# Patient Record
Sex: Male | Born: 1970 | Race: Black or African American | Hispanic: No | Marital: Single | Smoking: Current every day smoker
Health system: Southern US, Community
[De-identification: ages and names within clinical notes are randomized; demographics above are authoritative.]

## PROBLEM LIST (undated history)

## (undated) DIAGNOSIS — R079 Chest pain, unspecified: Secondary | ICD-10-CM

## (undated) DIAGNOSIS — I10 Essential (primary) hypertension: Secondary | ICD-10-CM

## (undated) HISTORY — PX: APPENDECTOMY: SHX54

## (undated) HISTORY — DX: Chest pain, unspecified: R07.9

---

## 2006-07-04 ENCOUNTER — Emergency Department: Payer: Self-pay | Admitting: Internal Medicine

## 2006-08-02 ENCOUNTER — Emergency Department: Payer: Self-pay | Admitting: Emergency Medicine

## 2008-05-30 ENCOUNTER — Emergency Department: Payer: Self-pay | Admitting: Emergency Medicine

## 2008-06-01 ENCOUNTER — Emergency Department: Payer: Self-pay | Admitting: Emergency Medicine

## 2008-06-03 ENCOUNTER — Emergency Department: Payer: Self-pay | Admitting: Emergency Medicine

## 2008-08-12 ENCOUNTER — Emergency Department: Payer: Self-pay | Admitting: Emergency Medicine

## 2009-05-07 ENCOUNTER — Emergency Department (HOSPITAL_COMMUNITY): Admission: EM | Admit: 2009-05-07 | Discharge: 2009-05-07 | Payer: Self-pay | Admitting: Emergency Medicine

## 2009-06-19 ENCOUNTER — Emergency Department: Payer: Self-pay | Admitting: Emergency Medicine

## 2009-08-17 ENCOUNTER — Emergency Department: Payer: Self-pay | Admitting: Emergency Medicine

## 2012-10-30 ENCOUNTER — Encounter (HOSPITAL_COMMUNITY): Payer: Self-pay | Admitting: *Deleted

## 2012-10-30 ENCOUNTER — Emergency Department (HOSPITAL_COMMUNITY)
Admission: EM | Admit: 2012-10-30 | Discharge: 2012-10-30 | Disposition: A | Discharge status: Home / Self Care | Attending: Emergency Medicine | Admitting: Emergency Medicine

## 2012-10-30 DIAGNOSIS — Z79899 Other long term (current) drug therapy: Secondary | ICD-10-CM | POA: Insufficient documentation

## 2012-10-30 DIAGNOSIS — Z87891 Personal history of nicotine dependence: Secondary | ICD-10-CM | POA: Insufficient documentation

## 2012-10-30 DIAGNOSIS — R51 Headache: Secondary | ICD-10-CM | POA: Insufficient documentation

## 2012-10-30 MED ORDER — METOCLOPRAMIDE HCL 5 MG/ML IJ SOLN
10.0000 mg | Freq: Once | INTRAMUSCULAR | Status: AC
  Administered 2012-10-30: 10 mg via INTRAVENOUS
  Filled 2012-10-30: qty 2

## 2012-10-30 MED ORDER — SODIUM CHLORIDE 0.9 % IV BOLUS (SEPSIS)
1000.0000 mL | Freq: Once | INTRAVENOUS | Status: AC
  Administered 2012-10-30: 1000 mL via INTRAVENOUS

## 2012-10-30 MED ORDER — MAGNESIUM SULFATE 40 MG/ML IJ SOLN
2.0000 g | Freq: Once | INTRAMUSCULAR | Status: AC
  Administered 2012-10-30: 2 g via INTRAVENOUS
  Filled 2012-10-30: qty 50

## 2012-10-30 MED ORDER — DIPHENHYDRAMINE HCL 50 MG/ML IJ SOLN
25.0000 mg | Freq: Once | INTRAMUSCULAR | Status: AC
  Administered 2012-10-30: 25 mg via INTRAVENOUS
  Filled 2012-10-30: qty 1

## 2012-10-30 MED ORDER — DEXAMETHASONE SODIUM PHOSPHATE 4 MG/ML IJ SOLN
10.0000 mg | Freq: Once | INTRAMUSCULAR | Status: AC
  Administered 2012-10-30: 10 mg via INTRAVENOUS
  Filled 2012-10-30: qty 3

## 2012-10-30 MED ORDER — NAPROXEN 500 MG PO TABS: 500.0000 mg | ORAL_TABLET | Freq: Two times a day (BID) | ORAL | Status: DC

## 2012-10-30 NOTE — ED Notes (Signed)
Called report to Nurse Hyacinth Meeker at [facility] [place].

## 2012-10-30 NOTE — ED Notes (Signed)
Pt to department via EMS.  Reports headache x2 weeks with no relief from ibuprofen.

## 2012-10-30 NOTE — ED Provider Notes (Signed)
CSN: [id number]     Arrival date & time 10/30/12  1478 History   First MD Initiated Contact with Patient 10/30/12 [phone]     Chief Complaint  Patient presents with  . Headache   (Consider location/radiation/quality/duration/timing/severity/associated sxs/prior Treatment) HPI History provided by patient. Is currently incarcerated. He has had headache continuously for the last 2 weeks gradual onset and now severe despite taking ibuprofen. He has sensitivity to light and noise. He has had headaches like this in the past and is unable to recall the last time he had a headache this severe, but this is not the worse headache of his life. Headache is frontal and mostly left-sided and has moved posteriorly. No associated fevers or neck stiffness. No rash or extremity pain. No thunderclap headache.  no syncope. No known alleviating factors.   History reviewed. No pertinent past medical history. Past Surgical History  Procedure Laterality Date  . Appendectomy     History reviewed. No pertinent family history. History  Substance Use Topics  . Smoking status: Former Games developer  . Smokeless tobacco: Not on file  . Alcohol Use: No    Review of Systems  Constitutional: Negative for fever and chills.  HENT: Negative for neck pain and neck stiffness.   Eyes: Negative for pain.  Respiratory: Negative for shortness of breath.   Cardiovascular: Negative for chest pain.  Gastrointestinal: Negative for abdominal pain.  Genitourinary: Negative for dysuria.  Musculoskeletal: Negative for back pain.  Skin: Negative for rash.  Neurological: Positive for headaches. Negative for speech difficulty and numbness.  All other systems reviewed and are negative.    Allergies  Review of patient's allergies indicates no known allergies.  Home Medications   Current Outpatient Rx  Name  Route  Sig  Dispense  Refill  . famotidine (PEPCID) 20 MG tablet   Oral   Take 20 mg by mouth 2 (two) times daily.         Marland Kitchen  ibuprofen (ADVIL,MOTRIN) 200 MG tablet   Oral   Take 200 mg by mouth every 6 (six) hours as needed for pain.         . tamsulosin (FLOMAX) 0.4 MG CAPS capsule   Oral   Take 0.4 mg by mouth.         . naproxen (NAPROSYN) 500 MG tablet   Oral   Take 1 tablet (500 mg total) by mouth 2 (two) times daily.   30 tablet   0    BP 158/101  Pulse 68  Temp(Src) 98 F (36.7 C) (Oral)  Resp 18  Ht 5\' 9"  (1.753 m)  Wt 240 lb (108.863 kg)  BMI 35.43 kg/m2  SpO2 100% Physical Exam  Constitutional: He is oriented to person, place, and time. He appears well-developed and well-nourished.  HENT:  Head: Normocephalic and atraumatic.  Eyes: Conjunctivae and EOM are normal. Pupils are equal, round, and reactive to light.  Neck: Full passive range of motion without pain. Neck supple. No thyromegaly present.  No meningismus  Cardiovascular: Normal rate, regular rhythm, S1 normal, S2 normal and intact distal pulses.   Pulmonary/Chest: Effort normal and breath sounds normal.  Abdominal: Soft. Bowel sounds are normal. There is no tenderness. There is no CVA tenderness.  Musculoskeletal: Normal range of motion.  Neurological: He is alert and oriented to person, place, and time. He has normal strength and normal reflexes. No cranial nerve deficit or sensory deficit. He displays a negative Romberg sign. GCS eye subscore is 4. GCS verbal  subscore is 5. GCS motor subscore is 6.  Speech clear no pronator drift. No facial droop. No focal deficits  Skin: Skin is warm and dry. No rash noted. No cyanosis. Nails show no clubbing.  Psychiatric: He has a normal mood and affect. His speech is normal and behavior is normal.    ED Course  Procedures (including critical care time)  IV fluids and IV headache cocktail provided : Benadryl, Reglan, Decadron, magnesium  On recheck headache is significantly improved. Repeat normal neuro exam unchanged.  No fever or deficits or indication for emergent imaging at this  time. Plan discharge back to facility With recommendation for get 8 hours of continuous and uninterrupted sleep. Continue NSAIDs. Follow up with neurology for persistent symptoms. Return to emergency department for seizure, fever, altered mental status, difficulty with speech or gait, or any weakness or numbness  MDM   1. Headache    Improved with IV fluids and IV medications Serial evaluations, improved condition Vital signs and nurses notes reviewed and considered     Sunnie Nielsen, MD 10/30/12 [phone]

## 2018-05-02 ENCOUNTER — Other Ambulatory Visit: Payer: Self-pay

## 2018-05-02 ENCOUNTER — Emergency Department
Admission: EM | Admit: 2018-05-02 | Discharge: 2018-05-02 | Disposition: A | Discharge status: Home / Self Care | Attending: Emergency Medicine | Admitting: Emergency Medicine

## 2018-05-02 DIAGNOSIS — F17200 Nicotine dependence, unspecified, uncomplicated: Secondary | ICD-10-CM | POA: Insufficient documentation

## 2018-05-02 DIAGNOSIS — Z79899 Other long term (current) drug therapy: Secondary | ICD-10-CM | POA: Insufficient documentation

## 2018-05-02 DIAGNOSIS — G43009 Migraine without aura, not intractable, without status migrainosus: Secondary | ICD-10-CM

## 2018-05-02 DIAGNOSIS — G43909 Migraine, unspecified, not intractable, without status migrainosus: Secondary | ICD-10-CM | POA: Insufficient documentation

## 2018-05-02 MED ORDER — METOCLOPRAMIDE HCL 10 MG PO TABS: 10.0000 mg | ORAL_TABLET | Freq: Three times a day (TID) | ORAL | 0 refills | Status: DC | PRN

## 2018-05-02 MED ORDER — SODIUM CHLORIDE 0.9 % IV BOLUS
1000.0000 mL | Freq: Once | INTRAVENOUS | Status: AC
  Administered 2018-05-02: 1000 mL via INTRAVENOUS

## 2018-05-02 MED ORDER — METOCLOPRAMIDE HCL 5 MG/ML IJ SOLN
10.0000 mg | Freq: Once | INTRAMUSCULAR | Status: AC
  Administered 2018-05-02: 10 mg via INTRAVENOUS
  Filled 2018-05-02: qty 2

## 2018-05-02 MED ORDER — KETOROLAC TROMETHAMINE 30 MG/ML IJ SOLN
30.0000 mg | Freq: Once | INTRAMUSCULAR | Status: AC
  Administered 2018-05-02: 30 mg via INTRAVENOUS
  Filled 2018-05-02: qty 1

## 2018-05-02 MED ORDER — DIPHENHYDRAMINE HCL 50 MG/ML IJ SOLN
25.0000 mg | Freq: Once | INTRAMUSCULAR | Status: AC
  Administered 2018-05-02: 25 mg via INTRAVENOUS
  Filled 2018-05-02: qty 1

## 2018-05-02 MED ORDER — IBUPROFEN 600 MG PO TABS: 600.0000 mg | ORAL_TABLET | Freq: Four times a day (QID) | ORAL | 0 refills | Status: DC | PRN

## 2018-05-02 NOTE — ED Notes (Signed)
NAD noted at time of D/C. Pt denies questions or concerns. Pt ambulatory to the lobby at this time where he states his friend is waiting to give him a ride. Driving precautions reviewed with patient due to the sedative effects of the medications.

## 2018-05-02 NOTE — Discharge Instructions (Signed)
Return to the ER for new, worsening, persistent severe headache, vomiting, weakness, or any other new or worsening symptoms that concern you.

## 2018-05-02 NOTE — ED Triage Notes (Signed)
Pt presents via POV c/o headache x3 days. Not relieved with BC powders per report. + light/sound/touch sensitivity.

## 2018-05-02 NOTE — ED Triage Notes (Signed)
emesis

## 2018-05-02 NOTE — ED Provider Notes (Signed)
[facility] Emergency Department Provider Note ____________________________________________   First MD Initiated Contact with Patient 05/02/18 1401     (approximate)  I have reviewed the triage vital signs and the nursing notes.   HISTORY  Chief Complaint Headache    HPI Gabriel Flowers is a 48 y.o. male with PMH as noted below who presents with headache, gradual onset and persistent course over the last 3 days, mainly on the left side of his head and retro-orbital.  It is associated with photophobia, phonophobia, and with nausea and vomiting.  The patient reports that he has had similar headaches several times previously.  He does not have any medication at home for them.  He denies any fever or neck stiffness.  He has had no trauma.   History reviewed. No pertinent past medical history.  There are no active problems to display for this patient.   Past Surgical History:  Procedure Laterality Date  . APPENDECTOMY      Prior to Admission medications   Medication Sig Start Date End Date Taking? Authorizing Provider  famotidine (PEPCID) 20 MG tablet Take 20 mg by mouth 2 (two) times daily.    [provider]  ibuprofen (ADVIL,MOTRIN) 600 MG tablet Take 1 tablet (600 mg total) by mouth every 6 (six) hours as needed. 05/02/18   Dionne Bucy, MD  metoCLOPramide (REGLAN) 10 MG tablet Take 1 tablet (10 mg total) by mouth every 8 (eight) hours as needed for up to 5 days for nausea or vomiting (or headache). 05/02/18 05/07/18  Dionne Bucy, MD  naproxen (NAPROSYN) 500 MG tablet Take 1 tablet (500 mg total) by mouth 2 (two) times daily. 10/30/12   Sunnie Nielsen, MD  tamsulosin (FLOMAX) 0.4 MG CAPS capsule Take 0.4 mg by mouth.    [provider]    Allergies Patient has no known allergies.  History reviewed. No pertinent family history.  Social History Social History   Tobacco Use  . Smoking status: Current Every Day Smoker  .  Smokeless tobacco: Never Used  Substance Use Topics  . Alcohol use: No  . Drug use: No    Review of Systems  Constitutional: No fever. Eyes: No visual changes.  Positive for photophobia. ENT: No neck pain. Cardiovascular: Denies chest pain. Respiratory: Denies shortness of breath. Gastrointestinal: Positive for nausea and vomiting.  Genitourinary: Negative for flank pain.  Musculoskeletal: Negative for back pain. Skin: Negative for rash. Neurological: Positive for headache.   ____________________________________________   PHYSICAL EXAM:  VITAL SIGNS: ED Triage Vitals  Enc Vitals Group     BP 05/02/18 1226 (!) 134/107     Pulse Rate 05/02/18 1226 88     Resp 05/02/18 1226 16     Temp 05/02/18 1226 97.7 F (36.5 C)     Temp Source 05/02/18 1226 Oral     SpO2 05/02/18 1226 98 %     Weight 05/02/18 1227 240 lb (108.9 kg)     Height 05/02/18 1227 5\' 9"  (1.753 m)     Head Circumference --      Peak Flow --      Pain Score 05/02/18 1227 10     Pain Loc --      Pain Edu? --      Excl. in GC? --     Constitutional: Alert and oriented.  Uncomfortable appearing but in no acute distress. Eyes: Conjunctivae are normal.  EOMI.  PERRLA. Head: Atraumatic. Nose: No congestion/rhinnorhea. Mouth/Throat: Mucous membranes are  moist.   Neck: Normal range of motion.  Cardiovascular: Good peripheral circulation. Respiratory: Normal respiratory effort.   Gastrointestinal: No distention.  Musculoskeletal: Extremities warm and well perfused.  Neurologic:  Normal speech and language.  Motor intact in all extremities.  Normal coordination with no ataxia.  No facial droop. Skin:  Skin is warm and dry. No rash noted. Psychiatric: Mood and affect are normal. Speech and behavior are normal.  ____________________________________________   LABS (all labs ordered are listed, but only abnormal results are displayed)  Labs Reviewed - No data to  display ____________________________________________  EKG  ____________________________________________  RADIOLOGY    ____________________________________________   PROCEDURES  Procedure(s) performed: No  Procedures  Critical Care performed: No ____________________________________________   INITIAL IMPRESSION / ASSESSMENT AND PLAN / ED COURSE  Pertinent labs & imaging results that were available during my care of the patient were reviewed by me and considered in my medical decision making (see chart for details).  48 year old male with PMH as noted above presents with gradual onset headache over the last 3 days, associated with nausea and vomiting as well as with photophobia and phonophobia.  He reports a prior history of similar headaches.  I reviewed the past medical records in Epic.  The patient was seen in the ED here in 2014 with a similar headache which improved with Reglan and Benadryl.  The patient reports he had a similar episode in 2016.  Overall presentation is consistent with migraine type headache.  Given the history of similar headaches in the past and the normal neurologic exam, there is no evidence of meningitis or SAH, and no indication for imaging. We will give Reglan, Toradol, Benadryl, and reassess.  ----------------------------------------- 4:00 PM on 05/02/2018 -----------------------------------------  The patient is feeling much better after the medications.  He feels well to go home.  He is stable for discharge at this time.  Return precautions given, and he expressed understanding. ____________________________________________   FINAL CLINICAL IMPRESSION(S) / ED DIAGNOSES  Final diagnoses:  Migraine without aura and without status migrainosus, not intractable      NEW MEDICATIONS STARTED DURING THIS VISIT:  New Prescriptions   IBUPROFEN (ADVIL,MOTRIN) 600 MG TABLET    Take 1 tablet (600 mg total) by mouth every 6 (six) hours as needed.    METOCLOPRAMIDE (REGLAN) 10 MG TABLET    Take 1 tablet (10 mg total) by mouth every 8 (eight) hours as needed for up to 5 days for nausea or vomiting (or headache).     Note:  This document was prepared using Dragon voice recognition software and may include unintentional dictation errors.    Dionne Bucy, MD 05/02/18 1600

## 2018-07-24 ENCOUNTER — Ambulatory Visit
Admission: EM | Admit: 2018-07-24 | Discharge: 2018-07-24 | Disposition: A | Discharge status: Home / Self Care | Payer: Self-pay

## 2018-07-24 ENCOUNTER — Encounter: Payer: Self-pay | Admitting: Emergency Medicine

## 2018-07-24 ENCOUNTER — Other Ambulatory Visit: Payer: Self-pay

## 2018-07-24 DIAGNOSIS — L0501 Pilonidal cyst with abscess: Secondary | ICD-10-CM

## 2018-07-24 MED ORDER — SULFAMETHOXAZOLE-TRIMETHOPRIM 800-160 MG PO TABS: 1.0000 | ORAL_TABLET | Freq: Two times a day (BID) | ORAL | 0 refills | Status: AC

## 2018-07-24 MED ORDER — OXYCODONE-ACETAMINOPHEN 5-325 MG PO TABS: 1.0000 | ORAL_TABLET | Freq: Four times a day (QID) | ORAL | 0 refills | Status: DC | PRN

## 2018-07-24 NOTE — ED Triage Notes (Signed)
Patient c/o abscess on his buttock for the past 2 days.

## 2018-07-24 NOTE — Discharge Instructions (Addendum)
Take medication as prescribed. Rest. Drink plenty of fluids.  Keep clean.  Warm compresses and heating pad.  Need to follow-up with surgery as soon as possible.  See above to schedule appointment this coming week.  Follow up with your primary care physician this week as needed. Return to Urgent care for new or worsening concerns.

## 2018-07-24 NOTE — ED Provider Notes (Signed)
[facility] ____________________________________________  Time seen: Approximately 4:20 PM  I have reviewed the triage vital signs and the nursing notes.   HISTORY  Chief Complaint Abscess   HPI Gabriel Flowers is a 48 y.o. male presenting for evaluation of tenderness and skin changes to buttocks.  Patient reports he has had a history of this in the past.  Denies recent flareup.  Denies injury or insect bite.  States the area has flared up in the last 2 days.  States area is very tender.  States has noticed some drainage today.  Denies fevers.  Continues to urinate and move bowels normally.  Denies abdominal pain, chest pain, shortness of breath, fevers or other complaints.  Denies other aggravating leaving factors.   History reviewed. No pertinent past medical history.  There are no active problems to display for this patient.   Past Surgical History:  Procedure Laterality Date  . APPENDECTOMY       No current facility-administered medications for this encounter.   Current Outpatient Medications:  .  amLODipine (NORVASC) 10 MG tablet, Take by mouth., Disp: , Rfl:  .  famotidine (PEPCID) 20 MG tablet, Take 20 mg by mouth 2 (two) times daily., Disp: , Rfl:  .  fluticasone (FLONASE) 50 MCG/ACT nasal spray, 2 sprays by Each Nare route daily., Disp: , Rfl:  .  tamsulosin (FLOMAX) 0.4 MG CAPS capsule, Take 0.4 mg by mouth., Disp: , Rfl:  .  furosemide (LASIX) 40 MG tablet, Take by mouth., Disp: , Rfl:  .  ibuprofen (ADVIL,MOTRIN) 600 MG tablet, Take 1 tablet (600 mg total) by mouth every 6 (six) hours as needed., Disp: 30 tablet, Rfl: 0 .  metoCLOPramide (REGLAN) 10 MG tablet, Take 1 tablet (10 mg total) by mouth every 8 (eight) hours as needed for up to 5 days for nausea or vomiting (or headache)., Disp: 12 tablet, Rfl: 0 .  naproxen (NAPROSYN) 500 MG tablet, Take 1 tablet (500 mg total) by mouth 2 (two) times daily., Disp: 30 tablet, Rfl: 0 .  oxyCODONE-acetaminophen  (PERCOCET/ROXICET) 5-325 MG tablet, Take 1 tablet by mouth every 6 (six) hours as needed for severe pain. Do not drive while taking as can cause drowsiness., Disp: 8 tablet, Rfl: 0 .  sulfamethoxazole-trimethoprim (BACTRIM DS) 800-160 MG tablet, Take 1 tablet by mouth 2 (two) times daily for 10 days., Disp: 20 tablet, Rfl: 0  Allergies Patient has no known allergies.  Family History  Problem Relation Age of Onset  . Osteoarthritis Mother   . Osteoarthritis Father     Social History Social History   Tobacco Use  . Smoking status: Current Every Day Smoker    Types: Cigarettes  . Smokeless tobacco: Never Used  Substance Use Topics  . Alcohol use: No  . Drug use: No    Review of Systems Constitutional: No fever Cardiovascular: Denies chest pain. Respiratory: Denies shortness of breath. Gastrointestinal: No abdominal pain.  No nausea, no vomiting.  No diarrhea.  No constipation. Genitourinary: Negative for dysuria. Musculoskeletal: Negative for back pain. Skin:positive skin changes  ____________________________________________   PHYSICAL EXAM:  VITAL SIGNS: ED Triage Vitals  Enc Vitals Group     BP 07/24/18 1445 (!) 149/82     Pulse Rate 07/24/18 1445 88     Resp 07/24/18 1445 16     Temp 07/24/18 1445 98.2 F (36.8 C)     Temp Source 07/24/18 1445 Oral     SpO2 07/24/18 1445 100 %  Weight 07/24/18 1442 200 lb (90.7 kg)     Height 07/24/18 1442 5\' 9"  (1.753 m)     Head Circumference --      Peak Flow --      Pain Score 07/24/18 1442 10     Pain Loc --      Pain Edu? --      Excl. in GC? --     Constitutional: Alert and oriented. Well appearing and in no acute distress. ENT      Head: Normocephalic and atraumatic. Cardiovascular: Normal rate, regular rhythm. Grossly normal heart sounds.  Good peripheral circulation. Respiratory: Normal respiratory effort without tachypnea nor retractions. Breath sounds are clear and equal bilaterally. No wheezes, rales,  rhonchi. Musculoskeletal:  Steady gait.  Neurologic:  Normal speech and language. Speech is normal. No gait instability.  Skin:  Skin is warm, dry.  Except: Left-sided gluteal cleft area of approximately 2 x 3 cm firmness with mild erythema with mild active drainage, no fluctuance, moderately tender.  Left upper gluteal cleft area erythematous 1 x 1 cm induration, no fluctuance, no drainage. Psychiatric: Mood and affect are normal. Speech and behavior are normal. Patient exhibits appropriate insight and judgment   ___________________________________________   LABS (all labs ordered are listed, but only abnormal results are displayed)  Labs Reviewed - No data to display   PROCEDURES Procedures   INITIAL IMPRESSION / ASSESSMENT AND PLAN / ED COURSE  Pertinent labs & imaging results that were available during my care of the patient were reviewed by me and considered in my medical decision making (see chart for details).  Well-appearing patient.  No acute distress.  Patient with pilonidal abscess.  Larger actively draining.  Will start patient on oral Bactrim, PRN Percocet, recommend keeping clean and warm compresses.  Follow-up surgeon this week.  Information given.Discussed indication, risks and benefits of medications with patient.  Discussed follow up with Primary care physician this week. Discussed follow up and return parameters including no resolution or any worsening concerns. Patient verbalized understanding and agreed to plan.   [place] controlled substance database reviewed, no recent controlled substances documented.  ____________________________________________   FINAL CLINICAL IMPRESSION(S) / ED DIAGNOSES  Final diagnoses:  Pilonidal abscess     ED Discharge Orders         Ordered    sulfamethoxazole-trimethoprim (BACTRIM DS) 800-160 MG tablet  2 times daily     07/24/18 1623    oxyCODONE-acetaminophen (PERCOCET/ROXICET) 5-325 MG tablet  Every 6 hours PRN      07/24/18 1623           Note: This dictation was prepared with Dragon dictation along with smaller phrase technology. Any transcriptional errors that result from this process are unintentional.         Renford Dills, NP 07/24/18 1824

## 2018-08-23 ENCOUNTER — Observation Stay: Payer: Self-pay

## 2018-08-23 ENCOUNTER — Observation Stay
Admission: EM | Admit: 2018-08-23 | Discharge: 2018-08-24 | Disposition: A | Discharge status: Home / Self Care | Payer: Self-pay | Attending: Internal Medicine | Admitting: Internal Medicine

## 2018-08-23 ENCOUNTER — Encounter: Payer: Self-pay | Admitting: *Deleted

## 2018-08-23 ENCOUNTER — Other Ambulatory Visit: Payer: Self-pay

## 2018-08-23 ENCOUNTER — Emergency Department: Payer: Self-pay

## 2018-08-23 ENCOUNTER — Ambulatory Visit
Admission: EM | Admit: 2018-08-23 | Discharge: 2018-08-23 | Disposition: A | Discharge status: Home / Self Care | Payer: Self-pay

## 2018-08-23 DIAGNOSIS — H409 Unspecified glaucoma: Secondary | ICD-10-CM | POA: Diagnosis present

## 2018-08-23 DIAGNOSIS — Z1159 Encounter for screening for other viral diseases: Secondary | ICD-10-CM | POA: Insufficient documentation

## 2018-08-23 DIAGNOSIS — I10 Essential (primary) hypertension: Secondary | ICD-10-CM

## 2018-08-23 DIAGNOSIS — H5461 Unqualified visual loss, right eye, normal vision left eye: Secondary | ICD-10-CM

## 2018-08-23 DIAGNOSIS — H3411 Central retinal artery occlusion, right eye: Principal | ICD-10-CM | POA: Diagnosis present

## 2018-08-23 DIAGNOSIS — Z79899 Other long term (current) drug therapy: Secondary | ICD-10-CM | POA: Insufficient documentation

## 2018-08-23 DIAGNOSIS — F17210 Nicotine dependence, cigarettes, uncomplicated: Secondary | ICD-10-CM | POA: Insufficient documentation

## 2018-08-23 HISTORY — DX: Essential (primary) hypertension: I10

## 2018-08-23 HISTORY — DX: Central retinal artery occlusion, right eye: H34.11

## 2018-08-23 LAB — CBC
HCT: 40.6 % (ref 39.0–52.0)
Hemoglobin: 14.3 g/dL (ref 13.0–17.0)
MCH: 32.8 pg (ref 26.0–34.0)
MCHC: 35.2 g/dL (ref 30.0–36.0)
MCV: 93.1 fL (ref 80.0–100.0)
Platelets: 237 10*3/uL (ref 150–400)
RBC: 4.36 MIL/uL (ref 4.22–5.81)
RDW: 13.2 % (ref 11.5–15.5)
WBC: 7.1 10*3/uL (ref 4.0–10.5)
nRBC: 0 % (ref 0.0–0.2)

## 2018-08-23 LAB — BASIC METABOLIC PANEL
Anion gap: 9 (ref 5–15)
BUN: 15 mg/dL (ref 6–20)
CO2: 23 mmol/L (ref 22–32)
Calcium: 9.3 mg/dL (ref 8.9–10.3)
Chloride: 106 mmol/L (ref 98–111)
Creatinine, Ser: 1.03 mg/dL (ref 0.61–1.24)
GFR calc Af Amer: >60 mL/min (ref >60)
GFR calc non Af Amer: >60 mL/min (ref >60)
Glucose, Bld: 90 mg/dL (ref 70–99)
Potassium: 3.8 mmol/L (ref 3.5–5.1)
Sodium: 138 mmol/L (ref 135–145)

## 2018-08-23 LAB — TROPONIN I (HIGH SENSITIVITY): Troponin I (High Sensitivity): 2 ng/L (ref <18)

## 2018-08-23 MED ORDER — LORAZEPAM 2 MG/ML IJ SOLN
1.0000 mg | INTRAMUSCULAR | Status: AC
  Administered 2018-08-24: 2 mg via INTRAVENOUS
  Filled 2018-08-23: qty 1

## 2018-08-23 MED ORDER — ACETAMINOPHEN 650 MG RE SUPP: 650.0000 mg | Freq: Four times a day (QID) | RECTAL | Status: DC | PRN

## 2018-08-23 MED ORDER — ACETAMINOPHEN 325 MG PO TABS: 650.0000 mg | ORAL_TABLET | Freq: Four times a day (QID) | ORAL | Status: DC | PRN

## 2018-08-23 MED ORDER — SODIUM CHLORIDE 0.9% FLUSH: 3.0000 mL | Freq: Once | INTRAVENOUS | Status: DC

## 2018-08-23 MED ORDER — GADOBUTROL 1 MMOL/ML IV SOLN
10.0000 mL | Freq: Once | INTRAVENOUS | Status: AC | PRN
  Administered 2018-08-24: 15:00:00 10 mL via INTRAVENOUS

## 2018-08-23 MED ORDER — ASPIRIN 81 MG PO CHEW
324.0000 mg | CHEWABLE_TABLET | Freq: Once | ORAL | Status: AC
  Administered 2018-08-23: 324 mg via ORAL
  Filled 2018-08-23: qty 4

## 2018-08-23 MED ORDER — ONDANSETRON HCL 4 MG PO TABS: 4.0000 mg | ORAL_TABLET | Freq: Four times a day (QID) | ORAL | Status: DC | PRN

## 2018-08-23 MED ORDER — ENOXAPARIN SODIUM 40 MG/0.4ML ~~LOC~~ SOLN: 40.0000 mg | SUBCUTANEOUS | Status: DC

## 2018-08-23 MED ORDER — ONDANSETRON HCL 4 MG/2ML IJ SOLN: 4.0000 mg | Freq: Four times a day (QID) | INTRAMUSCULAR | Status: DC | PRN

## 2018-08-23 NOTE — ED Notes (Signed)
Report called to Cynthia, RN

## 2018-08-23 NOTE — ED Provider Notes (Signed)
[facility] ____________________________________________  Time seen: Approximately 1:41 PM  I have reviewed the triage vital signs and the nursing notes.   HISTORY  Chief Complaint Loss of Vision  HPI Gabriel Flowers is a 48 y.o. male presenting for evaluation of vision loss to right eye.  Patient reports this morning at 6:30 AM he pressed the garage door open button as the door opened and the light shining through he lost vision to his right eye.  States left eye vision is normal.  States right eye lateral vision is somewhat fuzzy, but reports the medial right eye vision is blackish to purple and only seen some outlines.  Denies pain to right eye.  Denies any trauma or foreign body to right eye.  States he had something similar happen approximately 2 months ago but only lasted for 1 hour and then fully resolved without any recurrence.  States that he knows he needs glasses, but denies any other eye issues.  Denies headache, paresthesias, weakness, unilateral weakness, dizziness, injury.  Denies recent cough, congestion, chest pain, shortness of breath or fevers.  Reports otherwise feels well.  Again denies any pain.  Denies aggravating or alleviating factors.    Past Medical History:  Diagnosis Date  . Hypertension     There are no active problems to display for this patient.   Past Surgical History:  Procedure Laterality Date  . APPENDECTOMY       No current facility-administered medications for this encounter.   Current Outpatient Medications:  .  amLODipine (NORVASC) 10 MG tablet, Take by mouth., Disp: , Rfl:  .  furosemide (LASIX) 40 MG tablet, Take by mouth., Disp: , Rfl:  .  ibuprofen (ADVIL,MOTRIN) 600 MG tablet, Take 1 tablet (600 mg total) by mouth every 6 (six) hours as needed., Disp: 30 tablet, Rfl: 0 .  famotidine (PEPCID) 20 MG tablet, Take 20 mg by mouth 2 (two) times daily., Disp: , Rfl:  .  fluticasone (FLONASE) 50 MCG/ACT nasal spray, 2 sprays by  Each Nare route daily., Disp: , Rfl:  .  metoCLOPramide (REGLAN) 10 MG tablet, Take 1 tablet (10 mg total) by mouth every 8 (eight) hours as needed for up to 5 days for nausea or vomiting (or headache)., Disp: 12 tablet, Rfl: 0 .  naproxen (NAPROSYN) 500 MG tablet, Take 1 tablet (500 mg total) by mouth 2 (two) times daily., Disp: 30 tablet, Rfl: 0 .  oxyCODONE-acetaminophen (PERCOCET/ROXICET) 5-325 MG tablet, Take 1 tablet by mouth every 6 (six) hours as needed for severe pain. Do not drive while taking as can cause drowsiness., Disp: 8 tablet, Rfl: 0 .  tamsulosin (FLOMAX) 0.4 MG CAPS capsule, Take 0.4 mg by mouth., Disp: , Rfl:   Allergies Patient has no known allergies.  Family History  Problem Relation Age of Onset  . Osteoarthritis Mother   . Osteoarthritis Father     Social History Social History   Tobacco Use  . Smoking status: Current Every Day Smoker    Types: Cigarettes  . Smokeless tobacco: Never Used  Substance Use Topics  . Alcohol use: No  . Drug use: Yes    Types: Marijuana    Review of Systems Constitutional: No fever Eyes: Positive vision loss. Cardiovascular: Denies chest pain. Respiratory: Denies shortness of breath. Gastrointestinal: No abdominal pain.  Musculoskeletal: Negative for back pain. Skin: Negative for rash. Neurological: Negative for headaches, focal weakness or numbness.   ____________________________________________   PHYSICAL EXAM:  VITAL SIGNS: ED Triage Vitals  Enc Vitals Group     BP 08/23/18 1308 124/82     Pulse Rate 08/23/18 1308 77     Resp 08/23/18 1308 16     Temp 08/23/18 1308 98.3 F (36.8 C)     Temp Source 08/23/18 1308 Oral     SpO2 08/23/18 1308 100 %     Weight 08/23/18 1306 240 lb (108.9 kg)     Height 08/23/18 1306 5\' 9"  (1.753 m)     Head Circumference --      Peak Flow --      Pain Score 08/23/18 1306 0     Pain Loc --      Pain Edu? --      Excl. in GC? --      Visual Acuity  Right Eye Distance:    Left Eye Distance:    Constitutional: Alert and oriented. Well appearing and in no acute distress. Eyes: Conjunctivae are normal. PERRL. EOMI. Right eye vertical midline medial aspect loss of vision, midline lateral aspect vision intact. ENT      Head: Normocephalic and atraumatic. Cardiovascular: Normal rate, regular rhythm. Grossly normal heart sounds.  Good peripheral circulation. Respiratory: Normal respiratory effort without tachypnea nor retractions. Breath sounds are clear and equal bilaterally. No wheezes, rales, rhonchi. Musculoskeletal: Steady gait. Neurologic:  Normal speech and language.  No paresthesias.  5/5 strength to bilateral upper and lower extremities.  Speech is normal. No gait instability.  Skin:  Skin is warm, dry and intact. No rash noted. Psychiatric: Mood and affect are normal. Speech and behavior are normal. Patient exhibits appropriate insight and judgment   ___________________________________________   LABS (all labs ordered are listed, but only abnormal results are displayed)  Labs Reviewed - No data to display   PROCEDURES Procedures   INITIAL IMPRESSION / ASSESSMENT AND PLAN / ED COURSE  Pertinent labs & imaging results that were available during my care of the patient were reviewed by me and considered in my medical decision making (see chart for details).  Patient presenting for acute partial loss of right eye vision that started at 630 this morning.  Patient denies other symptoms.  Patient describing vertical midline medial vision changes.  Called and spoke with Dr. George Ina ophthalmology on-call who will evaluate patient at this time in office.  Patient states that his son will drive him directly to ophthalmology office.  Patient agreed to plan.  ____________________________________________   FINAL CLINICAL IMPRESSION(S) / ED DIAGNOSES  Final diagnoses:  Vision loss of right eye     ED Discharge Orders    None       Note: This  dictation was prepared with Dragon dictation along with smaller phrase technology. Any transcriptional errors that result from this process are unintentional.         Marylene Land, NP 08/23/18 1409

## 2018-08-23 NOTE — Discharge Instructions (Addendum)
Go directly to [facility] NOW.

## 2018-08-23 NOTE — ED Notes (Signed)
Patient transported to MRI 

## 2018-08-23 NOTE — ED Notes (Signed)
Pt states urgent care stated that he had a blood clot in his right eye- states that vision in his right eye is very blurry

## 2018-08-23 NOTE — ED Notes (Signed)
Handed pt phone for MRI screening. 

## 2018-08-23 NOTE — ED Notes (Signed)
Pt provided water. Lying on side talking on cell phone.

## 2018-08-23 NOTE — ED Triage Notes (Signed)
Patient states that this morning around 630am he raised the garage door and he was been unable to see out of his right eye. Patient states that he does not have pain in the eye. States that he had this happened about 2 months ago and it only lasted for an hour.

## 2018-08-23 NOTE — ED Triage Notes (Signed)
Pt ambulatory to triage. Pt sent from [facility] for eval.  Pt has no peripheral vision.  No headache, no weakness.  Pt sent from mri eval.  Pt alert  Speech clear.

## 2018-08-23 NOTE — ED Provider Notes (Signed)
[facility] Emergency Department Provider Note ____________________________________________   First MD Initiated Contact with Patient 08/23/18 2007     (approximate)  I have reviewed the triage vital signs and the nursing notes.   HISTORY  Chief Complaint Eye Problem    HPI Gabriel Flowers is a 48 y.o. male with PMH as noted below who presents with right eye vision loss, acute onset this morning around 6 AM and persistent since then although he states it is now slightly improving.  He is able to see shapes and faces.  He denies any pain.  He has no prior history of this.  The patient went to urgent care initially, and then was referred to ophthalmology.  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  Past Medical History:  Diagnosis Date  . Hypertension     Patient Active Problem List   Diagnosis Date Noted  . HTN (hypertension) 08/23/2018  . Vision loss, right eye 08/23/2018    Past Surgical History:  Procedure Laterality Date  . APPENDECTOMY      Prior to Admission medications   Medication Sig Start Date End Date Taking? Authorizing Provider  amLODipine (NORVASC) 10 MG tablet Take 10 mg by mouth daily.  05/08/18  Yes [provider]  furosemide (LASIX) 40 MG tablet Take 40 mg by mouth daily.    Yes [provider]  ibuprofen (ADVIL,MOTRIN) 600 MG tablet Take 1 tablet (600 mg total) by mouth every 6 (six) hours as needed. 05/02/18   Dionne BucySiadecki, Kari Montero, MD    Allergies Patient has no known allergies.  Family History  Problem Relation Age of Onset  . Osteoarthritis Mother   . Osteoarthritis Father     Social History Social History   Tobacco Use  . Smoking status: Current Every Day Smoker    Types: Cigarettes  . Smokeless tobacco: Never Used  Substance Use Topics  . Alcohol use: No  . Drug use: Yes    Types: Marijuana    Review of Systems  Constitutional: No fever. Eyes: Positive  for right eye vision loss. ENT: No sore throat. Cardiovascular: Denies chest pain. Respiratory: Denies shortness of breath. Gastrointestinal: No vomiting. Genitourinary: Negative for flank pain.  Musculoskeletal: Negative for back pain. Skin: Negative for rash. Neurological: Negative for headaches, focal weakness or numbness.  Positive for right eye vision loss.   ____________________________________________   PHYSICAL EXAM:  VITAL SIGNS: ED Triage Vitals  Enc Vitals Group     BP 08/23/18 1836 130/79     Pulse Rate 08/23/18 1836 74     Resp 08/23/18 1836 20     Temp 08/23/18 1839 99.1 F (37.3 C)     Temp Source 08/23/18 1836 Oral     SpO2 08/23/18 1836 96 %     Weight --      Height --      Head Circumference --      Peak Flow --      Pain Score 08/23/18 1837 0     Pain Loc --      Pain Edu? --      Excl. in GC? --     Constitutional: Alert and oriented. Well appearing and in no acute distress. Eyes: Conjunctivae are normal.  EOMI.  PERRLA. Head: Atraumatic. Nose: No congestion/rhinnorhea. Mouth/Throat: Mucous membranes are moist.   Neck: Normal range of motion.  Cardiovascular: Good peripheral circulation. Respiratory: Normal respiratory effort.   Gastrointestinal: No distention.  Musculoskeletal: Extremities warm and well perfused.  Neurologic:  Normal speech and language.  Motor and sensory intact in all extremities.  No ataxia.  Skin:  Skin is warm and dry. No rash noted. Psychiatric: Mood and affect are normal. Speech and behavior are normal.  ____________________________________________   LABS (all labs ordered are listed, but only abnormal results are displayed)  Labs Reviewed  NOVEL CORONAVIRUS, NAA (HOSPITAL ORDER, SEND-OUT TO REF LAB)  BASIC METABOLIC PANEL  CBC  TROPONIN I (HIGH SENSITIVITY)  TROPONIN I (HIGH SENSITIVITY)   ____________________________________________  EKG  ED ECG REPORT I, Arta Silence, the attending physician,  personally viewed and interpreted this ECG.  Date: 08/23/2018 EKG Time: 1841 Rate: 73 Rhythm: normal sinus rhythm QRS Axis: normal Intervals: normal ST/T Wave abnormalities: normal Narrative Interpretation: no evidence of acute ischemia  ____________________________________________  RADIOLOGY  CT head: No acute abnormality  ____________________________________________   PROCEDURES  Procedure(s) performed: No  Procedures  Critical Care performed: No ____________________________________________   INITIAL IMPRESSION / ASSESSMENT AND PLAN / ED COURSE  Pertinent labs & imaging results that were available during my care of the patient were reviewed by me and considered in my medical decision making (see chart for details).  49 year old male with PMH of hypertension (but noncompliant with medication per his own report) presents with acute right eye vision loss now more than 12 hours ago with no associated pain or other neurologic symptoms.  The patient went to urgent care and then was referred to ophthalmology.  After evaluation the patient was diagnosed with glaucoma as well as with CRAO and referred to the ED for stroke work-up.  On exam the patient is relatively comfortable appearing.  His vital signs are normal.  Other than the vision loss on the right, his neurologic exam is normal.  EKG is nonischemic.  Presentation is consistent with CRAO.  CT head was obtained and is negative.  The patient will need admission for further stroke work-up and observation.  ----------------------------------------- 9:55 PM on 08/23/2018 -----------------------------------------  I signed the patient out to the hospitalist Dr. Jannifer Franklin for admission.  _________________________  Gabriel Flowers was evaluated in Emergency Department on 08/23/2018 for the symptoms described in the history of present illness. He was evaluated in the context of the global COVID-19 pandemic, which necessitated  consideration that the patient might be at risk for infection with the SARS-CoV-2 virus that causes COVID-19. Institutional protocols and algorithms that pertain to the evaluation of patients at risk for COVID-19 are in a state of rapid change based on information released by regulatory bodies including the CDC and federal and state organizations. These policies and algorithms were followed during the patient's care in the ED.  ____________________________________________   FINAL CLINICAL IMPRESSION(S) / ED DIAGNOSES  Final diagnoses:  Central retinal artery occlusion of right eye      NEW MEDICATIONS STARTED DURING THIS VISIT:  New Prescriptions   No medications on file     Note:  This document was prepared using Dragon voice recognition software and may include unintentional dictation errors.    Arta Silence, MD 08/23/18 2156

## 2018-08-23 NOTE — ED Notes (Signed)
Sent rainbow to lab. 

## 2018-08-23 NOTE — ED Notes (Signed)
ED TO INPATIENT HANDOFF REPORT  ED Nurse Name and Phone #:  Ariel [phone]  S Name/Age/Gender Gabriel Flowers 48 y.o. male Room/Bed: ED09A/ED09A  Code Status   Code Status: Not on file  Home/SNF/Other Home Patient oriented to: self, place, time and situation Is this baseline? Yes   Triage Complete: Triage complete  Chief Complaint Eye Pain  Triage Note Pt ambulatory to triage. Pt sent from [facility] for eval.  Pt has no peripheral vision.  No headache, no weakness.  Pt sent from mri eval.  Pt alert  Speech clear.    Allergies No Known Allergies  Level of Care/Admitting Diagnosis ED Disposition    ED Disposition Condition [facility]: [place] [[id number]]  Level of Care: Med-Surg [16]  Covid Evaluation: Screening Protocol (No Symptoms)  Diagnosis: Vision loss, right eye [409811]  Admitting Physician: Lance Coon [[id number]]  Attending Physician: Lance Coon [[id number]]  PT Class (Do Not Modify): Observation [104]  PT Acc Code (Do Not Modify): Observation [10022]       B Medical/Surgery History Past Medical History:  Diagnosis Date  . Hypertension    Past Surgical History:  Procedure Laterality Date  . APPENDECTOMY       A IV Location/Drains/Wounds Patient Lines/Drains/Airways Status   Active Line/Drains/Airways    Name:   Placement date:   Placement time:   Site:   Days:   Peripheral IV 08/23/18 Right Forearm   08/23/18    2041    Forearm   less than 1          Intake/Output Last 24 hours No intake or output data in the 24 hours ending 08/23/18 2213  Labs/Imaging Results for orders placed or performed during the hospital encounter of 08/23/18 (from the past 48 hour(s))  Basic metabolic panel     Status: None   Collection Time: 08/23/18  6:38 PM  Result Value Ref Range   Sodium 138 135 - 145 mmol/L   Potassium 3.8 3.5 - 5.1 mmol/L   Chloride 106 98 - 111 mmol/L   CO2 23 22 - 32 mmol/L   Glucose,  Bld 90 70 - 99 mg/dL   BUN 15 6 - 20 mg/dL   Creatinine, Ser 1.03 0.61 - 1.24 mg/dL   Calcium 9.3 8.9 - 10.3 mg/dL   GFR calc non Af Amer >60 >60 mL/min   GFR calc Af Amer >60 >60 mL/min   Anion gap 9 5 - 15    Comment: Performed at [facility], [address] [place], [place] [address]  CBC     Status: None   Collection Time: 08/23/18  6:38 PM  Result Value Ref Range   WBC 7.1 4.0 - 10.5 K/uL   RBC 4.36 4.22 - 5.81 MIL/uL   Hemoglobin 14.3 13.0 - 17.0 g/dL   HCT 40.6 39.0 - 52.0 %   MCV 93.1 80.0 - 100.0 fL   MCH 32.8 26.0 - 34.0 pg   MCHC 35.2 30.0 - 36.0 g/dL   RDW 13.2 11.5 - 15.5 %   Platelets 237 150 - 400 K/uL   nRBC 0.0 0.0 - 0.2 %    Comment: Performed at [facility], [address] [place], [place] [address]  Troponin I (High Sensitivity)     Status: None   Collection Time: 08/23/18  6:38 PM  Result Value Ref Range   Troponin I (High Sensitivity) 2 <18 ng/L    Comment: (  NOTE) Elevated high sensitivity troponin I (hsTnI) values and significant  changes across serial measurements may suggest ACS but many other  chronic and acute conditions are known to elevate hsTnI results.  Refer to the "Links" section for chest pain algorithms and additional  guidance. Performed at [facility], [address] [place], [place] [address]    Ct Head Wo Contrast  Result Date: 08/23/2018 CLINICAL DATA:  Retinal artery occlusion EXAM: CT HEAD WITHOUT CONTRAST TECHNIQUE: Contiguous axial images were obtained from the base of the skull through the vertex without intravenous contrast. COMPARISON:  None. FINDINGS: Brain: No evidence of acute infarction, hemorrhage, hydrocephalus, extra-axial collection or mass lesion/mass effect. Vascular: Negative for hyperdense vessel Skull: Negative Sinuses/Orbits: Negative Other: None IMPRESSION: Negative CT head Electronically Signed   By: Marlan Palauharles  Clark M.D.   On: 08/23/2018 19:03    Pending Labs Unresulted Labs  (From admission, onward)    Start     Ordered   08/23/18 2030  Novel Coronavirus,NAA,(SEND-OUT TO REF LAB - TAT 24-48 hrs); Hosp Order  (Asymptomatic Patients Labs)  Once,   STAT    Question:  Rule Out  Answer:  Yes   08/23/18 2030   08/23/18 1838  Troponin I (High Sensitivity)  STAT Now then every 2 hours,   STAT     08/23/18 1838   Signed and Held  HIV antibody (Routine Testing)  Once,   R     Signed and Held   Signed and Held  CBC  (enoxaparin (LOVENOX)    CrCl >/= 30 ml/min)  Once,   R    Comments: Baseline for enoxaparin therapy IF NOT ALREADY DRAWN.  Notify MD if PLT < 100 K.    Signed and Held   Signed and Held  Creatinine, serum  (enoxaparin (LOVENOX)    CrCl >/= 30 ml/min)  Once,   R    Comments: Baseline for enoxaparin therapy IF NOT ALREADY DRAWN.    Signed and Held   Signed and Held  Creatinine, serum  (enoxaparin (LOVENOX)    CrCl >/= 30 ml/min)  Weekly,   R    Comments: while on enoxaparin therapy    Signed and Held   Signed and Held  Basic metabolic panel  Tomorrow morning,   R     Signed and Held   Signed and Held  CBC  Tomorrow morning,   R     Signed and Held          Vitals/Pain Today's Vitals   08/23/18 1836 08/23/18 1837 08/23/18 1839 08/23/18 2030  BP: 130/79   135/84  Pulse: 74   70  Resp: 20     Temp:   99.1 F (37.3 C)   TempSrc: Oral  Oral   SpO2: 96%   96%  PainSc:  0-No pain      Isolation Precautions No active isolations  Medications Medications  sodium chloride flush (NS) 0.9 % injection 3 mL (has no administration in time range)  aspirin chewable tablet 324 mg (324 mg Oral Given 08/23/18 2035)    Mobility walks Low fall risk   Focused Assessments      R Recommendations: See Admitting Provider Note  Report given to:   Additional Notes:

## 2018-08-23 NOTE — H&P (Signed)
[facility] - [place] [facility]   PATIENT NAME: Gabriel FurlongBarry Flowers    MR#:  [id number]  DATE OF BIRTH:  Dec 02, 1970  DATE OF ADMISSION:  08/23/2018  PRIMARY CARE PHYSICIAN: Patient, No Pcp Per   REQUESTING/REFERRING PHYSICIAN: Siadecki, MD  CHIEF COMPLAINT:   Chief Complaint  Patient presents with  . Eye Problem    HISTORY OF PRESENT ILLNESS:  Gabriel Flowers  is a 48 y.o. male who presents with chief complaint as above.  Patient experienced an episode of loss of vision in his right eye starting early in the morning.  When it did not improve he went to be seen in urgent care.  He was sent from there to ophthalmology office.  From ophthalmology he was sent to the ED for MRI and evaluation.  High concern for central retinal artery occlusion.  At this time the patient states that his vision is slowly returning and improving in that eye.  Hospitalist were called for admission  PAST MEDICAL HISTORY:   Past Medical History:  Diagnosis Date  . Hypertension      PAST SURGICAL HISTORY:   Past Surgical History:  Procedure Laterality Date  . APPENDECTOMY       SOCIAL HISTORY:   Social History   Tobacco Use  . Smoking status: Current Every Day Smoker    Types: Cigarettes  . Smokeless tobacco: Never Used  Substance Use Topics  . Alcohol use: No     FAMILY HISTORY:   Family History  Problem Relation Age of Onset  . Osteoarthritis Mother   . Osteoarthritis Father      DRUG ALLERGIES:  No Known Allergies  MEDICATIONS AT HOME:   Prior to Admission medications   Medication Sig Start Date End Date Taking? Authorizing Provider  amLODipine (NORVASC) 10 MG tablet Take 10 mg by mouth daily.  05/08/18  Yes [provider]  furosemide (LASIX) 40 MG tablet Take 40 mg by mouth daily.    Yes [provider]  ibuprofen (ADVIL,MOTRIN) 600 MG tablet Take 1 tablet (600 mg total) by mouth every 6 (six) hours as needed. 05/02/18   Dionne BucySiadecki,  Sebastian, MD    REVIEW OF SYSTEMS:  Review of Systems  Constitutional: Negative for chills, fever, malaise/fatigue and weight loss.  HENT: Negative for ear pain, hearing loss and tinnitus.   Eyes: Positive for blurred vision (right eye). Negative for double vision, pain and redness.  Respiratory: Negative for cough, hemoptysis and shortness of breath.   Cardiovascular: Negative for chest pain, palpitations, orthopnea and leg swelling.  Gastrointestinal: Negative for abdominal pain, constipation, diarrhea, nausea and vomiting.  Genitourinary: Negative for dysuria, frequency and hematuria.  Musculoskeletal: Negative for back pain, joint pain and neck pain.  Skin:       No acne, rash, or lesions  Neurological: Negative for dizziness, tremors, focal weakness and weakness.  Endo/Heme/Allergies: Negative for polydipsia. Does not bruise/bleed easily.  Psychiatric/Behavioral: Negative for depression. The patient is not nervous/anxious and does not have insomnia.      VITAL SIGNS:   Vitals:   08/23/18 1836 08/23/18 1839 08/23/18 2030  BP: 130/79  135/84  Pulse: 74  70  Resp: 20    Temp:  99.1 F (37.3 C)   TempSrc: Oral Oral   SpO2: 96%  96%   Wt Readings from Last 3 Encounters:  08/23/18 108.9 kg  07/24/18 90.7 kg  05/02/18 108.9 kg    PHYSICAL EXAMINATION:  Physical Exam  Vitals reviewed. Constitutional:  He is oriented to person, place, and time. He appears well-developed and well-nourished. No distress.  HENT:  Head: Normocephalic and atraumatic.  Mouth/Throat: Oropharynx is clear and moist.  Eyes: Pupils are equal, round, and reactive to light. Conjunctivae and EOM are normal. No scleral icterus.  Neck: Normal range of motion. Neck supple. No JVD present. No thyromegaly present.  Cardiovascular: Normal rate, regular rhythm and intact distal pulses. Exam reveals no gallop and no friction rub.  No murmur heard. Respiratory: Effort normal and breath sounds normal. No  respiratory distress. He has no wheezes. He has no rales.  GI: Soft. Bowel sounds are normal. He exhibits no distension. There is no abdominal tenderness.  Musculoskeletal: Normal range of motion.        General: No edema.     Comments: No arthritis, no gout  Lymphadenopathy:    He has no cervical adenopathy.  Neurological: He is alert and oriented to person, place, and time. No cranial nerve deficit.  No dysarthria, no aphasia, right peripheral vision intact, medial visual field minimally restricted  Skin: Skin is warm and dry. No rash noted. No erythema.  Psychiatric: He has a normal mood and affect. His behavior is normal. Judgment and thought content normal.    LABORATORY PANEL:   CBC Recent Labs  Lab 08/23/18 1838  WBC 7.1  HGB 14.3  HCT 40.6  PLT 237   ------------------------------------------------------------------------------------------------------------------  Chemistries  Recent Labs  Lab 08/23/18 1838  NA 138  K 3.8  CL 106  CO2 23  GLUCOSE 90  BUN 15  CREATININE 1.03  CALCIUM 9.3   ------------------------------------------------------------------------------------------------------------------  Cardiac Enzymes No results for input(s): TROPONINI in the last 168 hours. ------------------------------------------------------------------------------------------------------------------  RADIOLOGY:  Ct Head Wo Contrast  Result Date: 08/23/2018 CLINICAL DATA:  Retinal artery occlusion EXAM: CT HEAD WITHOUT CONTRAST TECHNIQUE: Contiguous axial images were obtained from the base of the skull through the vertex without intravenous contrast. COMPARISON:  None. FINDINGS: Brain: No evidence of acute infarction, hemorrhage, hydrocephalus, extra-axial collection or mass lesion/mass effect. Vascular: Negative for hyperdense vessel Skull: Negative Sinuses/Orbits: Negative Other: None IMPRESSION: Negative CT head Electronically Signed   By: Franchot Gallo M.D.   On:  08/23/2018 19:03    EKG:   Orders placed or performed during the hospital encounter of 08/23/18  . ED EKG within 10 minutes  . ED EKG within 10 minutes    IMPRESSION AND PLAN:  Principal Problem:   Central retinal artery occlusion of right eye - MRI ordered, per Ophthalmology he needs to follow up with Dr George Ina in clinic after discharge.   Active Problems:   Acute glaucoma of right eye - diagnosed by Ophthalmology. Per Dr Inda Coke recs, ordered Combigan eye drops BID for right eye.   HTN (hypertension) -home dose antihypertensives  Chart review performed and case discussed with ED provider. Labs, imaging and/or ECG reviewed by provider and discussed with patient/family. Management plans discussed with the patient and/or family.  COVID-19 status: Test pending  DVT PROPHYLAXIS: SubQ lovenox   GI PROPHYLAXIS:  None  ADMISSION STATUS: Observation  CODE STATUS: Full  TOTAL TIME TAKING CARE OF THIS PATIENT: 40 minutes.   This patient was evaluated in the context of the global COVID-19 pandemic, which necessitated consideration that the patient might be at risk for infection with the SARS-CoV-2 virus that causes COVID-19. Institutional protocols and algorithms that pertain to the evaluation of patients at risk for COVID-19 are in a state of rapid change based on information  released by regulatory bodies including the CDC and federal and state organizations. These policies and algorithms were followed to the best of this provider's knowledge to date during the patient's care at this facility.  Barney DrainDavid F Blandina Renaldo 08/23/2018, 9:55 PM  [facility]  Office  [phone]  CC: Primary care physician; Patient, No Pcp Per  Note:  This document was prepared using Dragon voice recognition software and may include unintentional dictation errors.

## 2018-08-24 ENCOUNTER — Observation Stay: Payer: Self-pay

## 2018-08-24 DIAGNOSIS — H409 Unspecified glaucoma: Secondary | ICD-10-CM | POA: Diagnosis present

## 2018-08-24 HISTORY — DX: Unspecified glaucoma: H40.9

## 2018-08-24 LAB — CBC
HCT: 41.1 % (ref 39.0–52.0)
Hemoglobin: 14.5 g/dL (ref 13.0–17.0)
MCH: 32.9 pg (ref 26.0–34.0)
MCHC: 35.3 g/dL (ref 30.0–36.0)
MCV: 93.2 fL (ref 80.0–100.0)
Platelets: 227 10*3/uL (ref 150–400)
RBC: 4.41 MIL/uL (ref 4.22–5.81)
RDW: 13.2 % (ref 11.5–15.5)
WBC: 6.2 10*3/uL (ref 4.0–10.5)
nRBC: 0 % (ref 0.0–0.2)

## 2018-08-24 LAB — BASIC METABOLIC PANEL
Anion gap: 7 (ref 5–15)
BUN: 14 mg/dL (ref 6–20)
CO2: 25 mmol/L (ref 22–32)
Calcium: 8.8 mg/dL — ABNORMAL LOW (ref 8.9–10.3)
Chloride: 105 mmol/L (ref 98–111)
Creatinine, Ser: 0.99 mg/dL (ref 0.61–1.24)
GFR calc Af Amer: >60 mL/min (ref >60)
GFR calc non Af Amer: >60 mL/min (ref >60)
Glucose, Bld: 101 mg/dL — ABNORMAL HIGH (ref 70–99)
Potassium: 3.8 mmol/L (ref 3.5–5.1)
Sodium: 137 mmol/L (ref 135–145)

## 2018-08-24 LAB — TROPONIN I (HIGH SENSITIVITY): Troponin I (High Sensitivity): 2 ng/L (ref <18)

## 2018-08-24 MED ORDER — BRIMONIDINE TARTRATE 0.2 % OP SOLN
1.0000 [drp] | Freq: Two times a day (BID) | OPHTHALMIC | Status: DC
  Administered 2018-08-24 (×2): 1 [drp] via OPHTHALMIC
  Filled 2018-08-24: qty 5

## 2018-08-24 MED ORDER — TIMOLOL MALEATE 0.5 % OP SOLN
1.0000 [drp] | Freq: Two times a day (BID) | OPHTHALMIC | Status: DC
  Administered 2018-08-24 (×2): 1 [drp] via OPHTHALMIC
  Filled 2018-08-24: qty 5

## 2018-08-24 MED ORDER — DIAZEPAM 5 MG PO TABS: 5.0000 mg | ORAL_TABLET | Freq: Once | ORAL | Status: DC

## 2018-08-24 NOTE — Progress Notes (Signed)
Patient discharged home per MD order. All discharge instructions given and all questions answered. 

## 2018-08-24 NOTE — Progress Notes (Signed)
[facility] NAME: Gabriel Flowers    MR#:  [id number]  DATE OF BIRTH:  16-Jan-1971  SUBJECTIVE:   Patient presented to the hospital due to right blurry vision and noted to have right central artery occlusion.  Admitted to the hospital to rule out underlying CVA.  CT head was negative, MRI is still pending.  Patient denies any numbness tingling or any focal weakness.  Says the blurry vision has improved since yesterday.  REVIEW OF SYSTEMS:    Review of Systems  Constitutional: Negative for chills and fever.  HENT: Negative for congestion and tinnitus.   Eyes: Positive for blurred vision. Negative for double vision.  Respiratory: Negative for cough, shortness of breath and wheezing.   Cardiovascular: Negative for chest pain, orthopnea and PND.  Gastrointestinal: Negative for abdominal pain, diarrhea, nausea and vomiting.  Genitourinary: Negative for dysuria and hematuria.  Neurological: Negative for dizziness, sensory change and focal weakness.  All other systems reviewed and are negative.   Nutrition: Regular Tolerating Diet: Yes Tolerating PT: Ambulatory  DRUG ALLERGIES:  No Known Allergies  VITALS:  Blood pressure (!) 144/82, pulse 65, temperature 98.2 F (36.8 C), temperature source Oral, resp. rate 20, height 5\' 9"  (1.753 m), weight 108.8 kg, SpO2 97 %.  PHYSICAL EXAMINATION:   Physical Exam  GENERAL:  48 y.o.-year-old patient lying in bed in no acute distress.  EYES: Pupils equal, round, reactive to light and accommodation. No scleral icterus. Extraocular muscles intact.  HEENT: Head atraumatic, normocephalic. Oropharynx and nasopharynx clear.  NECK:  Supple, no jugular venous distention. No thyroid enlargement, no tenderness.  LUNGS: Normal breath sounds bilaterally, no wheezing, rales, rhonchi. No use of accessory muscles of respiration.  CARDIOVASCULAR: S1, S2 normal. No murmurs, rubs, or gallops.  ABDOMEN: Soft,  nontender, nondistended. Bowel sounds present. No organomegaly or mass.  EXTREMITIES: No cyanosis, clubbing or edema b/l.    NEUROLOGIC: Cranial nerves II through XII are intact. No focal Motor or sensory deficits b/l.   PSYCHIATRIC: The patient is alert and oriented x 3.  SKIN: No obvious rash, lesion, or ulcer.    LABORATORY PANEL:   CBC Recent Labs  Lab 08/24/18 0357  WBC 6.2  HGB 14.5  HCT 41.1  PLT 227   ------------------------------------------------------------------------------------------------------------------  Chemistries  Recent Labs  Lab 08/24/18 0357  NA 137  K 3.8  CL 105  CO2 25  GLUCOSE 101*  BUN 14  CREATININE 0.99  CALCIUM 8.8*   ------------------------------------------------------------------------------------------------------------------  Cardiac Enzymes No results for input(s): TROPONINI in the last 168 hours. ------------------------------------------------------------------------------------------------------------------  RADIOLOGY:  Ct Head Wo Contrast  Result Date: 08/23/2018 CLINICAL DATA:  Retinal artery occlusion EXAM: CT HEAD WITHOUT CONTRAST TECHNIQUE: Contiguous axial images were obtained from the base of the skull through the vertex without intravenous contrast. COMPARISON:  None. FINDINGS: Brain: No evidence of acute infarction, hemorrhage, hydrocephalus, extra-axial collection or mass lesion/mass effect. Vascular: Negative for hyperdense vessel Skull: Negative Sinuses/Orbits: Negative Other: None IMPRESSION: Negative CT head Electronically Signed   By: Franchot Gallo M.D.   On: 08/23/2018 19:03     ASSESSMENT AND PLAN:   48 year old male with past medical history of hypertension who presented to the hospital due to blurry vision in the right eye and noted to have right central artery occlusion.  1.  Right eye blurry vision/right central artery occlusion-patient was diagnosed as per ophthalmology as an outpatient.  Patient was  sent to the hospital to  rule out underlying CVA. -CT head is negative, MRI of the brain, MRA of the head neck is still pending. -Clinically patient is improved.  He has no other focal neurological deficits. - Continue aspirin  2.  Right central artery occlusion-continue timolol eyedrops along with Alphagan eyedrops as per ophthalmology. - Once CVA has been ruled out patient can follow-up with outpatient ophthalmology.    All the records are reviewed and case discussed with Care Management/Social Worker. Management plans discussed with the patient, family and they are in agreement.  CODE STATUS: Full code  DVT Prophylaxis: Lovenox  TOTAL TIME TAKING CARE OF THIS PATIENT: 30 minutes.   POSSIBLE D/C later today, DEPENDING ON CLINICAL CONDITION.   Houston SirenVivek J Urijah Arko M.D on 08/24/2018 at 1:18 PM  Between 7am to 6pm - Pager - [phone]  After 6pm go to [email or web address] - Social research officer, governmentpassword [email or web address]  [facility]  Office  [phone]  CC: Primary care physician; Patient, No Pcp Per

## 2018-08-25 LAB — HIV ANTIBODY (ROUTINE TESTING W REFLEX): HIV Screen 4th Generation wRfx: NONREACTIVE

## 2018-08-25 LAB — NOVEL CORONAVIRUS, NAA (HOSP ORDER, SEND-OUT TO REF LAB; TAT 18-24 HRS): SARS-CoV-2, NAA: NOT DETECTED

## 2018-08-27 NOTE — Discharge Summary (Signed)
[facility] NAME: Gabriel Flowers    MR#:  [id number]  DATE OF BIRTH:  1970/08/04  DATE OF ADMISSION:  08/23/2018 ADMITTING PHYSICIAN: Lance Coon, MD  DATE OF DISCHARGE: 08/24/2018  6:34 PM  PRIMARY CARE PHYSICIAN: Patient, No Pcp Per    ADMISSION DIAGNOSIS:  Central retinal artery occlusion of right eye [H34.11]  DISCHARGE DIAGNOSIS:  Principal Problem:   Central retinal artery occlusion of right eye Active Problems:   HTN (hypertension)   Acute glaucoma of right eye   SECONDARY DIAGNOSIS:   Past Medical History:  Diagnosis Date  . Hypertension     HOSPITAL COURSE:   48 year old male with past medical history of hypertension who presented to the hospital due to blurry vision in the right eye and noted to have right central artery occlusion.  1.  Right eye blurry vision/right central artery occlusion-patient was diagnosed as per ophthalmology as an outpatient.  Patient was sent to the hospital to rule out underlying CVA. -CT head is negative, RI MRA of the brain was negative for acute pathology.  Patient's carotid duplex slowed no evidence of hemodynamically significant carotid stenosis.  Patient is clinically feeling better and therefore being discharged.  Stroke has been ruled out.  2.  Right central artery occlusion-continue timolol eyedrops along with Alphagan eyedrops as per ophthalmology. - follow up with Ophthalmology as outpatient. Marland Kitchen   DISCHARGE CONDITIONS:   Stable.   CONSULTS OBTAINED:    DRUG ALLERGIES:  No Known Allergies  DISCHARGE MEDICATIONS:   Allergies as of 08/24/2018   No Known Allergies     Medication List    TAKE these medications   amLODipine 10 MG tablet Commonly known as: NORVASC Take 10 mg by mouth daily.   furosemide 40 MG tablet Commonly known as: LASIX Take 40 mg by mouth daily.   ibuprofen 600 MG tablet Commonly known as: ADVIL Take 1 tablet (600 mg total) by mouth every  6 (six) hours as needed.         DISCHARGE INSTRUCTIONS:   DIET:  Cardiac diet  DISCHARGE CONDITION:  Stable  ACTIVITY:  Activity as tolerated  OXYGEN:  Home Oxygen: No.   Oxygen Delivery: room air  DISCHARGE LOCATION:  home   If you experience worsening of your admission symptoms, develop shortness of breath, life threatening emergency, suicidal or homicidal thoughts you must seek medical attention immediately by calling 911 or calling your MD immediately  if symptoms less severe.  You Must read complete instructions/literature along with all the possible adverse reactions/side effects for all the Medicines you take and that have been prescribed to you. Take any new Medicines after you have completely understood and accpet all the possible adverse reactions/side effects.   Please note  You were cared for by a hospitalist during your hospital stay. If you have any questions about your discharge medications or the care you received while you were in the hospital after you are discharged, you can call the unit and asked to speak with the hospitalist on call if the hospitalist that took care of you is not available. Once you are discharged, your primary care physician will handle any further medical issues. Please note that NO REFILLS for any discharge medications will be authorized once you are discharged, as it is imperative that you return to your primary care physician (or establish a relationship with a primary care physician if you do not have one) for your aftercare needs  so that they can reassess your need for medications and monitor your lab values.     DATA REVIEW:   CBC Recent Labs  Lab 08/24/18 0357  WBC 6.2  HGB 14.5  HCT 41.1  PLT 227    Chemistries  Recent Labs  Lab 08/24/18 0357  NA 137  K 3.8  CL 105  CO2 25  GLUCOSE 101*  BUN 14  CREATININE 0.99  CALCIUM 8.8*    Cardiac Enzymes No results for input(s): TROPONINI in the last 168  hours.  Microbiology Results  Results for orders placed or performed during the hospital encounter of 08/23/18  Novel Coronavirus,NAA,(SEND-OUT TO REF LAB - TAT 24-48 hrs); Hosp Order     Status: None   Collection Time: 08/23/18  8:42 PM   Specimen: Nasopharyngeal Swab; Respiratory  Result Value Ref Range Status   SARS-CoV-2, NAA NOT DETECTED NOT DETECTED Final    Comment: (NOTE) This test was developed and its performance characteristics determined by [facility]. This test has not been FDA cleared or approved. This test has been authorized by FDA under an Emergency Use Authorization (EUA). This test is only authorized for the duration of time the declaration that circumstances exist justifying the authorization of the emergency use of in vitro diagnostic tests for detection of SARS-CoV-2 virus and/or diagnosis of COVID-19 infection under section 564(b)(1) of the Act, 21 U.S.C. 409WJX-9(J)(4360bbb-3(b)(1), unless the authorization is terminated or revoked sooner. When diagnostic testing is negative, the possibility of a false negative result should be considered in the context of a patient's recent exposures and the presence of clinical signs and symptoms consistent with COVID-19. An individual without symptoms of COVID-19 and who is not shedding SARS-CoV-2 virus would expect to have a negative (not detected) result in this assay. Performed  At: [facility] [address] [place], [place] [id number] Jolene SchimkeNagendra Sanjai MD YQ:[phone]    Coronavirus Source NASOPHARYNGEAL  Final    Comment: Performed at [facility], [address] [place], [place] [address]    RADIOLOGY:  No results found.    Management plans discussed with the patient, family and they are in agreement.  CODE STATUS:  Code Status History    Date Active Date Inactive Code Status Order ID Comments User Context   08/23/2018 2303 08/24/2018 2134 Full Code [id number]  Oralia ManisWillis, David, MD Inpatient    Advance Care Planning Activity      TOTAL TIME TAKING CARE OF THIS PATIENT: 40 minutes.    Houston SirenVivek J Bryker Fletchall M.D on 08/27/2018 at 4:00 PM  Between 7am to 6pm - Pager - [phone]  After 6pm go to [email or web address] - Social research officer, governmentpassword EPAS [facility]  [facility]  Office  [phone]  CC: Primary care physician; Patient, No Pcp Per

## 2020-09-12 ENCOUNTER — Other Ambulatory Visit: Payer: Self-pay

## 2020-09-12 DIAGNOSIS — Z79899 Other long term (current) drug therapy: Secondary | ICD-10-CM | POA: Insufficient documentation

## 2020-09-12 DIAGNOSIS — W2209XA Striking against other stationary object, initial encounter: Secondary | ICD-10-CM | POA: Insufficient documentation

## 2020-09-12 DIAGNOSIS — I10 Essential (primary) hypertension: Secondary | ICD-10-CM | POA: Insufficient documentation

## 2020-09-12 DIAGNOSIS — F17210 Nicotine dependence, cigarettes, uncomplicated: Secondary | ICD-10-CM | POA: Insufficient documentation

## 2020-09-12 DIAGNOSIS — S80811A Abrasion, right lower leg, initial encounter: Secondary | ICD-10-CM | POA: Insufficient documentation

## 2020-09-12 DIAGNOSIS — S81812A Laceration without foreign body, left lower leg, initial encounter: Secondary | ICD-10-CM | POA: Insufficient documentation

## 2020-09-12 DIAGNOSIS — Y9302 Activity, running: Secondary | ICD-10-CM | POA: Insufficient documentation

## 2020-09-12 NOTE — ED Triage Notes (Addendum)
Pt presents to ER after he states he was being chased by a dog and when he turned to run away, he ran into a low wall, hitting his legs on them. Pt denies being bit by dog.  Pt hs several superficial abrasions noted to right leg with bleeding controlled, and what appears to be a deeper cut on left shin area.  Pt ambulatory to triage and c/o 10/10 pain to legs.

## 2020-09-13 ENCOUNTER — Emergency Department
Admission: EM | Admit: 2020-09-13 | Discharge: 2020-09-13 | Disposition: A | Discharge status: Home / Self Care | Payer: Self-pay | Attending: Emergency Medicine | Admitting: Emergency Medicine

## 2020-09-13 ENCOUNTER — Emergency Department: Payer: Self-pay

## 2020-09-13 DIAGNOSIS — S81812A Laceration without foreign body, left lower leg, initial encounter: Secondary | ICD-10-CM

## 2020-09-13 DIAGNOSIS — T07XXXA Unspecified multiple injuries, initial encounter: Secondary | ICD-10-CM

## 2020-09-13 MED ORDER — CEPHALEXIN 500 MG PO CAPS: 500.0000 mg | ORAL_CAPSULE | Freq: Three times a day (TID) | ORAL | 0 refills | Status: DC

## 2020-09-13 MED ORDER — KETOROLAC TROMETHAMINE 60 MG/2ML IM SOLN
30.0000 mg | Freq: Once | INTRAMUSCULAR | Status: AC
  Administered 2020-09-13: 30 mg via INTRAMUSCULAR
  Filled 2020-09-13: qty 2

## 2020-09-13 MED ORDER — OXYCODONE-ACETAMINOPHEN 5-325 MG PO TABS
1.0000 | ORAL_TABLET | Freq: Once | ORAL | Status: AC
  Administered 2020-09-13: 1 via ORAL
  Filled 2020-09-13: qty 1

## 2020-09-13 MED ORDER — LIDOCAINE HCL (PF) 1 % IJ SOLN
5.0000 mL | Freq: Once | INTRAMUSCULAR | Status: AC
  Administered 2020-09-13: 5 mL
  Filled 2020-09-13: qty 5

## 2020-09-13 MED ORDER — HYDROCODONE-ACETAMINOPHEN 5-325 MG PO TABS: 1.0000 | ORAL_TABLET | Freq: Four times a day (QID) | ORAL | 0 refills | Status: DC | PRN

## 2020-09-13 MED ORDER — CEPHALEXIN 500 MG PO CAPS
500.0000 mg | ORAL_CAPSULE | Freq: Once | ORAL | Status: AC
  Administered 2020-09-13: 500 mg via ORAL
  Filled 2020-09-13: qty 1

## 2020-09-13 NOTE — ED Provider Notes (Signed)
[facility] Emergency Department Provider Note   ____________________________________________   Event Date/Time   First MD Initiated Contact with Patient 09/13/20 [phone]     (approximate)  I have reviewed the triage vital signs and the nursing notes.   HISTORY  Chief Complaint Leg Injury    HPI Gabriel Flowers is a 50 y.o. male who presents to the ED from home with a chief complaint of BLE injury.  Patient was being chased by a stray dog and was running when he ran into a low wall, striking both lower legs.  Patient denies being bitten by the dog.  Tetanus is up-to-date, less than 5 years.  Presents with superficial abrasions and laceration to both anterior shins.  Voices no other complaints or injuries.     Past Medical History:  Diagnosis Date   Hypertension     Patient Active Problem List   Diagnosis Date Noted   Acute glaucoma of right eye 08/24/2018   HTN (hypertension) 08/23/2018   Central retinal artery occlusion of right eye 08/23/2018    Past Surgical History:  Procedure Laterality Date   APPENDECTOMY      Prior to Admission medications   Medication Sig Start Date End Date Taking? Authorizing Provider  cephALEXin (KEFLEX) 500 MG capsule Take 1 capsule (500 mg total) by mouth 3 (three) times daily. 09/13/20  Yes Irean Hong, MD  HYDROcodone-acetaminophen (NORCO) 5-325 MG tablet Take 1 tablet by mouth every 6 (six) hours as needed for moderate pain. 09/13/20  Yes Irean Hong, MD  amLODipine (NORVASC) 10 MG tablet Take 10 mg by mouth daily.  05/08/18   [provider]  furosemide (LASIX) 40 MG tablet Take 40 mg by mouth daily.     [provider]  ibuprofen (ADVIL,MOTRIN) 600 MG tablet Take 1 tablet (600 mg total) by mouth every 6 (six) hours as needed. 05/02/18   Dionne Bucy, MD    Allergies Patient has no known allergies.  Family History  Problem Relation Age of Onset   Osteoarthritis Mother    Osteoarthritis  Father     Social History Social History   Tobacco Use   Smoking status: Every Day    Types: Cigarettes   Smokeless tobacco: Never  Vaping Use   Vaping Use: Never used  Substance Use Topics   Alcohol use: No   Drug use: Yes    Types: Marijuana    Review of Systems  Constitutional: No fever/chills Eyes: No visual changes. ENT: No sore throat. Cardiovascular: Denies chest pain. Respiratory: Denies shortness of breath. Gastrointestinal: No abdominal pain.  No nausea, no vomiting.  No diarrhea.  No constipation. Genitourinary: Negative for dysuria. Musculoskeletal: Positive for BLE injury.  Negative for back pain. Skin: Negative for rash. Neurological: Negative for headaches, focal weakness or numbness.   ____________________________________________   PHYSICAL EXAM:  VITAL SIGNS: ED Triage Vitals [09/12/20 2335]  Enc Vitals Group     BP (!) 135/102     Pulse Rate 82     Resp 16     Temp 98.5 F (36.9 C)     Temp Source Oral     SpO2 97 %     Weight 240 lb (108.9 kg)     Height 5\' 9"  (1.753 m)     Head Circumference      Peak Flow      Pain Score 10     Pain Loc      Pain Edu?  Excl. in GC?     Constitutional: Alert and oriented. Well appearing and in mild acute distress. Eyes: Conjunctivae are normal. PERRL. EOMI. Head: Atraumatic. Nose: Atraumatic. Mouth/Throat: Mucous membranes are moist.  No dental malocclusion. Neck: No stridor.  No cervical spine tenderness to palpation. Cardiovascular: Normal rate, regular rhythm. Grossly normal heart sounds.  Good peripheral circulation. Respiratory: Normal respiratory effort.  No retractions. Lungs CTAB. Gastrointestinal: Soft and nontender. No distention. No abdominal bruits. No CVA tenderness. Musculoskeletal:  RLE: Multiple abrasions to anterior shin without laceration.  2+ distal pulses.  Brisk, less than 5-second capillary refill. LLE: Abrasions to anterior shin.  1 cm nonbleeding laceration to anterior  shin.  2+ distal pulses.  Brisk, less than 5-second cap refill. Neurologic:  Normal speech and language. No gross focal neurologic deficits are appreciated. No gait instability. Skin:  Skin is warm, dry and intact. No rash noted. Psychiatric: Mood and affect are normal. Speech and behavior are normal.  ____________________________________________   LABS (all labs ordered are listed, but only abnormal results are displayed)  Labs Reviewed - No data to display ____________________________________________  EKG  None ____________________________________________  RADIOLOGY I, Myliyah Rebuck J, personally viewed and evaluated these images (plain radiographs) as part of my medical decision making, as well as reviewing the written report by the radiologist.  ED MD interpretation: No fracture or dislocation  Official radiology report(s): DG Tibia/Fibula Left  Result Date: 09/13/2020 CLINICAL DATA:  Fall, left lower extremity laceration EXAM: LEFT TIBIA AND FIBULA - 2 VIEW COMPARISON:  None. FINDINGS: Normal alignment. No acute fracture or dislocation. 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 Soft tissues are unremarkable. IMPRESSION: No acute fracture or dislocation. Electronically Signed   By: Helyn Numbers MD   On: 09/13/2020 01:27    ____________________________________________   PROCEDURES  Procedure(s) performed (including Critical Care):  Marland KitchenMarland KitchenLaceration Repair  Date/Time: 09/13/2020 2:27 AM Performed by: Irean Hong, MD Authorized by: Irean Hong, MD   Consent:    Consent obtained:  Verbal   Consent given by:  Patient   Risks, benefits, and alternatives were discussed: yes     Risks discussed:  Infection Universal protocol:    Patient identity confirmed:  Verbally with patient Anesthesia:    Anesthesia method:  None Laceration details:    Location:  Leg   Leg location:  L lower leg   Length (cm):  1   Depth (mm):   3 Pre-procedure details:    Preparation:  Patient was prepped and draped in usual sterile fashion Treatment:    Area cleansed with:  Saline   Amount of cleaning:  Standard Skin repair:    Repair method: 1 Dermaclip. Approximation:    Approximation:  Close Repair type:    Repair type:  Simple   ____________________________________________   INITIAL IMPRESSION / ASSESSMENT AND PLAN / ED COURSE  As part of my medical decision making, I reviewed the following data within the electronic MEDICAL RECORD NUMBER Nursing notes reviewed and incorporated, Radiograph reviewed, Notes from prior ED visits, and [place] Controlled Substance Database     50 year old male presenting with lower leg abrasions and laceration.  No dog bite.  Will image left leg to evaluate for fracture.  Administer analgesia, antibiotic, wound cleansing.  Clinical Course as of 09/13/20 7564  Thu Sep 13, 2020  3329 Updated patient on x-ray result.  Apply derma clip to left lower leg laceration.  Will discharge home on antibiotics and analgesia to use  as needed.  Strict return precautions given.  Patient verbalizes understanding agrees with plan of care. [JS]    Clinical Course User Index [JS] Irean Hong, MD     ____________________________________________   FINAL CLINICAL IMPRESSION(S) / ED DIAGNOSES  Final diagnoses:  Abrasions of multiple sites  Leg laceration, left, initial encounter     ED Discharge Orders          Ordered    cephALEXin (KEFLEX) 500 MG capsule  3 times daily        09/13/20 0248    HYDROcodone-acetaminophen (NORCO) 5-325 MG tablet  Every 6 hours PRN        09/13/20 0248             Note:  This document was prepared using Dragon voice recognition software and may include unintentional dictation errors.    Irean Hong, MD 09/13/20 [phone]

## 2020-09-13 NOTE — Discharge Instructions (Addendum)
1.  Take antibiotic as prescribed (Keflex 500 mg 3 times daily x7 days). 2.  You may remove Dermaclip in 7 days. 3.  Take Ibuprofen as needed for pain; Norco as needed for more severe pain. 4.  Return to the ER for worsening symptoms, fever, purulent discharge or other concerns.

## 2020-09-13 NOTE — ED Notes (Signed)
Rad at bedside.

## 2020-10-15 ENCOUNTER — Ambulatory Visit (INDEPENDENT_AMBULATORY_CARE_PROVIDER_SITE_OTHER): Payer: Self-pay

## 2020-10-15 ENCOUNTER — Ambulatory Visit
Admission: EM | Admit: 2020-10-15 | Discharge: 2020-10-15 | Disposition: A | Discharge status: Home / Self Care | Payer: Self-pay | Attending: Emergency Medicine | Admitting: Emergency Medicine

## 2020-10-15 ENCOUNTER — Other Ambulatory Visit: Payer: Self-pay

## 2020-10-15 DIAGNOSIS — S81812D Laceration without foreign body, left lower leg, subsequent encounter: Secondary | ICD-10-CM

## 2020-10-15 DIAGNOSIS — L089 Local infection of the skin and subcutaneous tissue, unspecified: Secondary | ICD-10-CM

## 2020-10-15 MED ORDER — IBUPROFEN 600 MG PO TABS: 600.0000 mg | ORAL_TABLET | Freq: Four times a day (QID) | ORAL | 0 refills | Status: DC | PRN

## 2020-10-15 MED ORDER — HIBICLENS 4 % EX LIQD: Freq: Every day | CUTANEOUS | 0 refills | Status: DC | PRN

## 2020-10-15 MED ORDER — CEPHALEXIN 500 MG PO CAPS: 1000.0000 mg | ORAL_CAPSULE | Freq: Two times a day (BID) | ORAL | 0 refills | Status: AC

## 2020-10-15 NOTE — ED Triage Notes (Signed)
Patient states that he was chased by a dog 3 weeks ago and ran into a brick wall. Patient with open wounds to bilateral legs. Patient states that left leg has been worse and is painful, states that he is concerned for infection.

## 2020-10-15 NOTE — ED Provider Notes (Signed)
HPI  SUBJECTIVE:  Gabriel Flowers is a 50 y.o. male who presents with worsening pain, purulent drainage coming from a left lower laceration that he sustained a month ago.  He states that he sustained repeated trauma to the area ,  the scab came off a week and a half ago.  He reports dull, constant, burning and sharp pain.  Purulent drainage starting 3 days ago.  No leg swelling, erythema, fevers, body aches, nausea, vomiting.  He has been taking ibuprofen 400 mg as needed with improvement in his symptoms.  Symptoms are worse with palpation.  States that the abrasions on his right lower extremity are healing without any problem.  Patient was seen in the ED on 7/21 for multiple abrasions sustained to bilateral lower extremities while running away from a dog, left tib-fib was negative for fracture.  Left laceration was cleaned, had 1 derma clip applied.  He was sent home with 7 days of Keflex and Norco.  States that he finished the Keflex.  Past medical history negative for diabetes, HIV, immunocompromise, MRSA.  He is a smoker.  PMD: None  Past Medical History:  Diagnosis Date   Hypertension     Past Surgical History:  Procedure Laterality Date   APPENDECTOMY      Family History  Problem Relation Age of Onset   Osteoarthritis Mother    Osteoarthritis Father     Social History   Tobacco Use   Smoking status: Every Day    Types: Cigarettes   Smokeless tobacco: Never  Vaping Use   Vaping Use: Never used  Substance Use Topics   Alcohol use: No   Drug use: Yes    Types: Marijuana    No current facility-administered medications for this encounter.  Current Outpatient Medications:    cephALEXin (KEFLEX) 500 MG capsule, Take 2 capsules (1,000 mg total) by mouth 2 (two) times daily for 7 days., Disp: 28 capsule, Rfl: 0   chlorhexidine (HIBICLENS) 4 % external liquid, Apply topically daily as needed. Dilute 10-15 mL in water, Use daily when bathing for 1-2 weeks, Disp: 120 mL, Rfl: 0    ibuprofen (ADVIL) 600 MG tablet, Take 1 tablet (600 mg total) by mouth every 6 (six) hours as needed., Disp: 30 tablet, Rfl: 0   amLODipine (NORVASC) 10 MG tablet, Take 10 mg by mouth daily. , Disp: , Rfl:    furosemide (LASIX) 40 MG tablet, Take 40 mg by mouth daily. , Disp: , Rfl:   No Known Allergies   ROS  As noted in HPI.   Physical Exam  BP 119/86 (BP Location: Left Arm)   Pulse 67   Temp 98.4 F (36.9 C) (Oral)   Resp 18   Ht 5\' 9"  (1.753 m)   Wt 108.9 kg   SpO2 99%   BMI 35.44 kg/m   Constitutional: Well developed, well nourished, no acute distress Eyes:  EOMI, conjunctiva normal bilaterally HENT: Normocephalic, atraumatic,mucus membranes moist Respiratory: Normal inspiratory effort Cardiovascular: Normal rate GI: nondistended skin: See musculoskeletal exam Musculoskeletal: 3 x 1 cm linear laceration distal anterior left lower extremity.  Positive tenderness.  Positive localized swelling.  No appreciable erythema.  No expressible purulent drainage.     Multiple healed abrasions right lower extremity.  Neurologic: Alert & oriented x 3, no focal neuro deficits Psychiatric: Speech and behavior appropriate   ED Course   Medications - No data to display  Orders Placed This Encounter  Procedures   DG Tibia/Fibula Left  Standing Status:   Standing    Number of Occurrences:   1    Order Specific Question:   Reason for Exam (SYMPTOM  OR DIAGNOSIS REQUIRED)    Answer:   Laceration anterior left tibia 3 weeks ago, rule out osteomyelitis    No results found for this or any previous visit (from the past 24 hour(s)). DG Tibia/Fibula Left  Result Date: 10/15/2020 CLINICAL DATA:  Left leg laceration 3 weeks ago. EXAM: LEFT TIBIA AND FIBULA - 2 VIEW COMPARISON:  None. FINDINGS: There is no evidence of fracture or other focal bone lesions. Soft tissues are unremarkable. No lytic destruction is noted. IMPRESSION: Negative. Electronically Signed   By: Lupita Raider  M.D.   On: 10/15/2020 15:28    ED Clinical Impression  1. Laceration of left lower extremity, subsequent encounter   2. Infected laceration      ED Assessment/Plan  ER records reviewed.  As noted in HPI.  Patient with a wound infection.  Will x-ray left tib-fib to rule out osteomyelitis.  If normal, will send home with Hibiclens, Tylenol/ibuprofen, Keflex.  This appears to be a completely separate infection.  Doubt MRSA.  He finished the Keflex 3 weeks ago.  Will provide primary care list for ongoing care and will order assistance in finding a PMD.  Reviewed imaging independently.  No osteomyelitis.  See radiology report for full details.  X-ray normal.  Plan as above.  Discussed imaging, MDM, treatment plan, and plan for follow-up with patient. Discussed sn/sx that should prompt return to the ED. patient agrees with plan.   Meds ordered this encounter  Medications   cephALEXin (KEFLEX) 500 MG capsule    Sig: Take 2 capsules (1,000 mg total) by mouth 2 (two) times daily for 7 days.    Dispense:  28 capsule    Refill:  0   ibuprofen (ADVIL) 600 MG tablet    Sig: Take 1 tablet (600 mg total) by mouth every 6 (six) hours as needed.    Dispense:  30 tablet    Refill:  0   chlorhexidine (HIBICLENS) 4 % external liquid    Sig: Apply topically daily as needed. Dilute 10-15 mL in water, Use daily when bathing for 1-2 weeks    Dispense:  120 mL    Refill:  0      *This clinic note was created using Scientist, clinical (histocompatibility and immunogenetics). Therefore, there may be occasional mistakes despite careful proofreading.  ?    Domenick Gong, MD 10/15/20 [phone]

## 2020-10-15 NOTE — Discharge Instructions (Addendum)
Your x-ray was negative for bone infection.  Finish the Keflex, if you feel better.  Keep it clean with Hibiclens soap and water.  Keep covered during the day, give it dry time at night.  May take 600 mg of ibuprofen combined with 1000 mg of Tylenol together 3-4 times a day as needed for pain.  Here is a list of primary care providers who are taking new patients:  Dr. Elizabeth Sauer [address] [place] [address] [phone]  [facility] Primary Care at [facility] [address] [place], [place] [address] [phone]  [facility] [place] [address] [place] [address]  [phone]  [facility] [address] [place], [place] [address] [phone]  [facility] [address] [place]  [phone] [place], [place] [address]  Here are clinics/ other resources who will see you if you do not have insurance. Some have certain criteria that you must meet. Call them and find out what they are:  [facility]: [address], [place], [place] [address] Phone: [phone] Hours: First and Third Saturdays of each Month, 9 a.m. - 1 p.m.  [facility]: [address] [place], [place] [address] Phone: [phone] Hours: Tuesday, 4 p.m. - 8 p.m. Thursday, 1 p.m. - 8 p.m. Wednesday, 9 a.m. - [facility] [address] [place], [place] [address] Phone: [phone] Pharmacy Phone Number: [phone] Dental Phone Number: [phone] [facility] Insurance Help: [phone]  Dental Hours: Monday - Thursday, 8 a.m. - 6 p.m.  [facility] [address] [place], [place] [address] Phone: [phone] Pharmacy Phone Number: [phone] [facility] Insurance Help: [phone]  [facility] [address] [place], [place] [address] Phone: [phone] Pharmacy Phone Number: [phone] [facility] Insurance Help: [phone]  [facility] [address] [place],  [place] [address] Phone: [phone] [facility] Insurance Help: [phone]   [facility] [address] [place], [place] [address] Phone: [phone]  Go to www.goodrx.com  or www.costplusdrugs.com to look up your medications. This will give you a list of where you can find your prescriptions at the most affordable prices. Or ask the pharmacist what the cash price is, or if they have any other discount programs available to help make your medication more affordable. This can be less expensive than what you would pay with insurance.

## 2022-02-08 ENCOUNTER — Ambulatory Visit
Admission: EM | Admit: 2022-02-08 | Discharge: 2022-02-08 | Disposition: A | Discharge status: Home / Self Care | Payer: No Typology Code available for payment source | Attending: Nurse Practitioner | Admitting: Nurse Practitioner

## 2022-02-08 ENCOUNTER — Telehealth: Payer: Self-pay

## 2022-02-08 DIAGNOSIS — N410 Acute prostatitis: Secondary | ICD-10-CM | POA: Insufficient documentation

## 2022-02-08 DIAGNOSIS — R369 Urethral discharge, unspecified: Secondary | ICD-10-CM | POA: Insufficient documentation

## 2022-02-08 DIAGNOSIS — Z113 Encounter for screening for infections with a predominantly sexual mode of transmission: Secondary | ICD-10-CM | POA: Insufficient documentation

## 2022-02-08 LAB — URINALYSIS, ROUTINE W REFLEX MICROSCOPIC
Glucose, UA: NEGATIVE mg/dL
Hgb urine dipstick: NEGATIVE
Ketones, ur: NEGATIVE mg/dL
Leukocytes,Ua: NEGATIVE
Nitrite: NEGATIVE
Protein, ur: NEGATIVE mg/dL
Specific Gravity, Urine: 1.025 (ref 1.005–1.030)
pH: 6.5 (ref 5.0–8.0)

## 2022-02-08 MED ORDER — SULFAMETHOXAZOLE-TRIMETHOPRIM 800-160 MG PO TABS: 1.0000 | ORAL_TABLET | Freq: Two times a day (BID) | ORAL | 0 refills | Status: AC

## 2022-02-08 NOTE — Telephone Encounter (Signed)
Called pt to inform that swab is invalid due to no fluid being in tube. Pt did not collect properly & will need to recollect swab. Pt stated he will try to come back today or another day to reswab. Informed pt that swab collect today would be considered invalid. Pt verbalized understanding to info.

## 2022-02-08 NOTE — Discharge Instructions (Signed)
Start Bactrim twice daily for 2 weeks.  Please follow-up with your PCP as this treatment may need to be extended for several weeks. The clinic will contact you for any positive results of the testing done today Rest and fluids Please go to the emergency room for any worsening symptoms

## 2022-02-08 NOTE — ED Provider Notes (Signed)
[facility]    CSN: [id number] Arrival date & time: 02/08/22  0945      History   Chief Complaint Chief Complaint  Patient presents with   Prostate Pain    HPI Gabriel Flowers is a 51 y.o. male presents for evaluation of prostate concern.  Patient reports 2 days of pain in his prostate with difficulty starting and stopping his urine stream.  He denies dysuria but does report some penile discharge.  No STD exposure but he would like to be screened.  Denies any fevers or chills or testicular pain or swelling.  He has a history of prostatitis and states this feels the same as his previous episode.  He has not taken any OTC medications for symptoms.  No other concerns at this time.  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 Authorizing Provider  sulfamethoxazole-trimethoprim (BACTRIM DS) 800-160 MG tablet Take 1 tablet by mouth 2 (two) times daily for 14 days. 02/08/22 02/22/22 Yes Radford Pax, NP  amLODipine (NORVASC) 10 MG tablet Take 10 mg by mouth daily.  05/08/18   [provider]  chlorhexidine (HIBICLENS) 4 % external liquid Apply topically daily as needed. Dilute 10-15 mL in water, Use daily when bathing for 1-2 weeks 10/15/20   Domenick Gong, MD  furosemide (LASIX) 40 MG tablet Take 40 mg by mouth daily.     [provider]  ibuprofen (ADVIL) 600 MG tablet Take 1 tablet (600 mg total) by mouth every 6 (six) hours as needed. 10/15/20   Domenick Gong, MD    Family History Family History  Problem Relation Age of Onset   Osteoarthritis Mother     Osteoarthritis Father     Social History Social History   Tobacco Use   Smoking status: Every Day    Types: Cigarettes   Smokeless tobacco: Never  Vaping Use   Vaping Use: Never used  Substance Use Topics   Alcohol use: No   Drug use: Yes    Types: Marijuana     Allergies   Patient has no known allergies.   Review of Systems Review of Systems  Genitourinary:  Positive for penile discharge.       Prostate pain     Physical Exam Triage Vital Signs ED Triage Vitals  Enc Vitals Group     BP 02/08/22 1148 (!) 147/85     Pulse Rate 02/08/22 1148 71     Resp 02/08/22 1148 16     Temp 02/08/22 1148 98 F (36.7 C)     Temp Source 02/08/22 1148 Oral     SpO2 02/08/22 1148 96 %     Weight 02/08/22 1146 239 lb (108.4 kg)     Height 02/08/22 1146 5\' 9"  (1.753 m)     Head Circumference --      Peak Flow --      Pain Score 02/08/22 1146 6     Pain Loc --      Pain Edu? --      Excl. in  GC? --    No data found.  Updated Vital Signs BP (!) 147/85 (BP Location: Right Arm)   Pulse 71   Temp 98 F (36.7 C) (Oral)   Resp 16   Ht 5\' 9"  (1.753 m)   Wt 239 lb (108.4 kg)   SpO2 96%   BMI 35.29 kg/m   Visual Acuity Right Eye Distance:   Left Eye Distance:   Bilateral Distance:    Right Eye Near:   Left Eye Near:    Bilateral Near:     Physical Exam Vitals and nursing note reviewed.  Constitutional:      Appearance: Normal appearance.  HENT:     Head: Normocephalic and atraumatic.  Eyes:     Pupils: Pupils are equal, round, and reactive to light.  Cardiovascular:     Rate and Rhythm: Normal rate.  Pulmonary:     Effort: Pulmonary effort is normal.  Genitourinary:    Comments: Patient declined DRE and prefers self swab Skin:    General: Skin is warm and dry.  Neurological:     General: No focal deficit present.     Mental Status: He is alert and oriented to person, place, and time.  Psychiatric:        Mood and Affect: Mood normal.        Behavior:  Behavior normal.      UC Treatments / Results  Labs (all labs ordered are listed, but only abnormal results are displayed) Labs Reviewed  URINALYSIS, ROUTINE W REFLEX MICROSCOPIC - Abnormal; Notable for the following components:      Result Value   Bilirubin Urine SMALL (*)    All other components within normal limits  CYTOLOGY, (ORAL, ANAL, URETHRAL) ANCILLARY ONLY    EKG   Radiology No results found.  Procedures Procedures (including critical care time)  Medications Ordered in UC Medications - No data to display  Initial Impression / Assessment and Plan / UC Course  I have reviewed the triage vital signs and the nursing notes.  Pertinent labs & imaging results that were available during my care of the patient were reviewed by me and considered in my medical decision making (see chart for details).     Reviewed exam and symptoms with patient. As he reports this is similar to previous prostatitis infections, start Bactrim.  Will do 2 weeks and advised patient he needs to follow-up with his PCP as this may need to be extended for additional weeks and he verbalized understanding Will contact patient for any positive STD testing done today.  UA negative for UTI Rest and fluids ER precautions reviewed and patient verbalized understanding Final Clinical Impressions(s) / UC Diagnoses   Final diagnoses:  Screening examination for STD (sexually transmitted disease)  Penile discharge  Acute prostatitis     Discharge Instructions      Start Bactrim twice daily for 2 weeks.  Please follow-up with your PCP as this treatment may need to be extended for several weeks. The clinic will contact you for any positive results of the testing done today Rest and fluids Please go to the emergency room for any worsening symptoms   ED Prescriptions     Medication Sig Dispense Auth. Provider   sulfamethoxazole-trimethoprim (BACTRIM DS) 800-160 MG tablet Take 1 tablet by mouth 2  (two) times daily for 14 days. 28 tablet , NP      PDMP not reviewed this encounter.   Radford Pax, NP 02/08/22 1227

## 2022-02-08 NOTE — ED Triage Notes (Signed)
Pt c/o prostate pain & lower abd pain, states he has urgency to go but has no flow of urine, discharge & is constipated IHW:TUUE AM @7 :30. Has hx of gallstones & states when he sits his bottom become irritated.

## 2022-12-18 IMAGING — CR DG TIBIA/FIBULA 2V*L*
4 series · 4 of 4 positions shown · non-contrast
Comparison: None.

CLINICAL DATA: Left leg laceration 3 weeks ago.

EXAM:
LEFT TIBIA AND FIBULA - 2 VIEW

[tibia ap (1 of 2)]
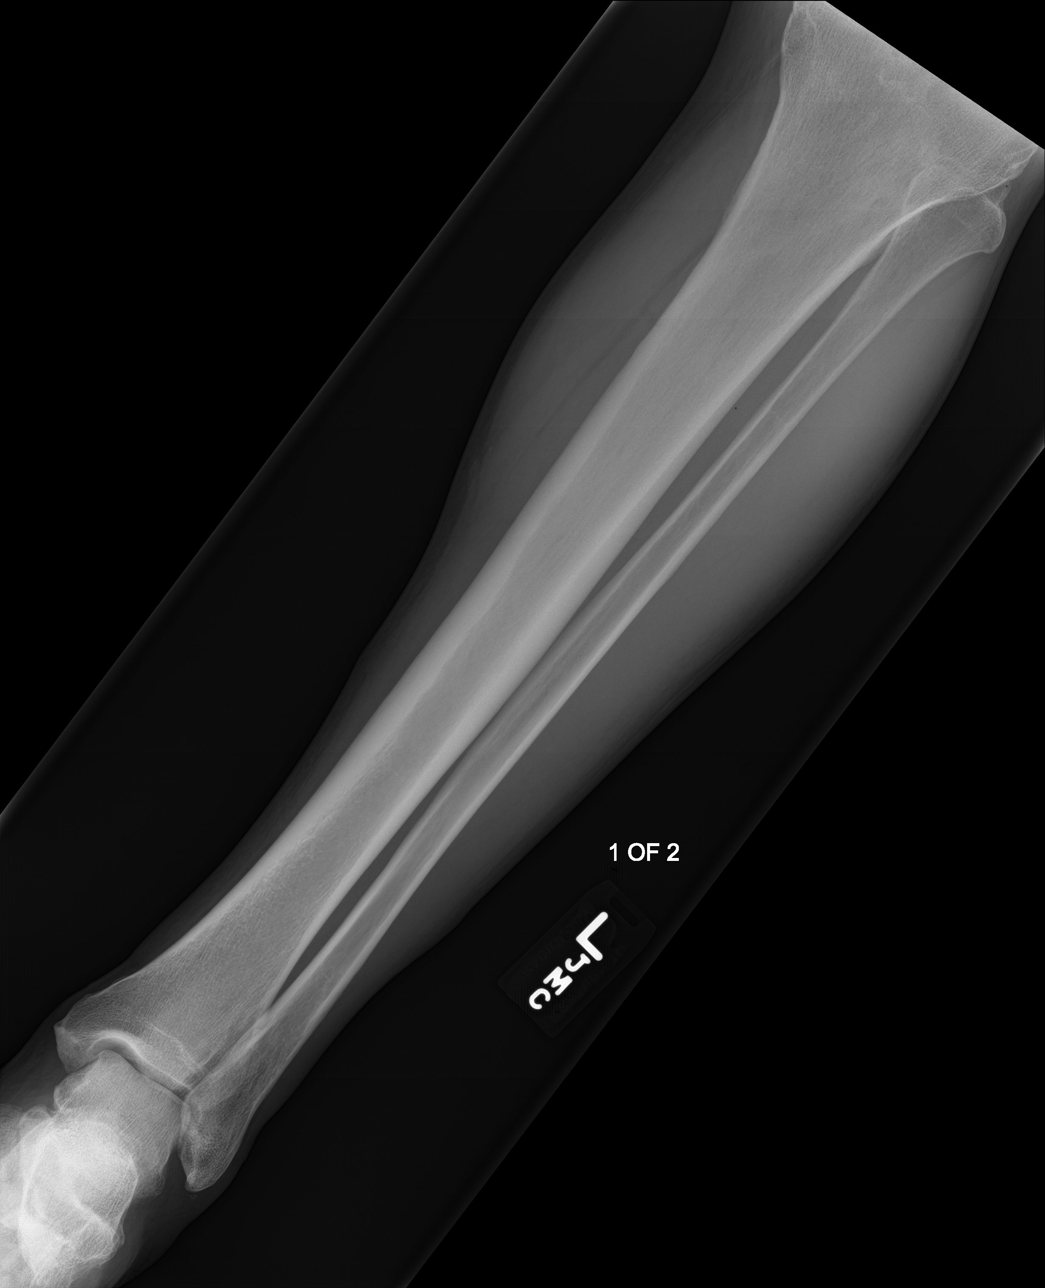

[tibia ap (2 of 2)]
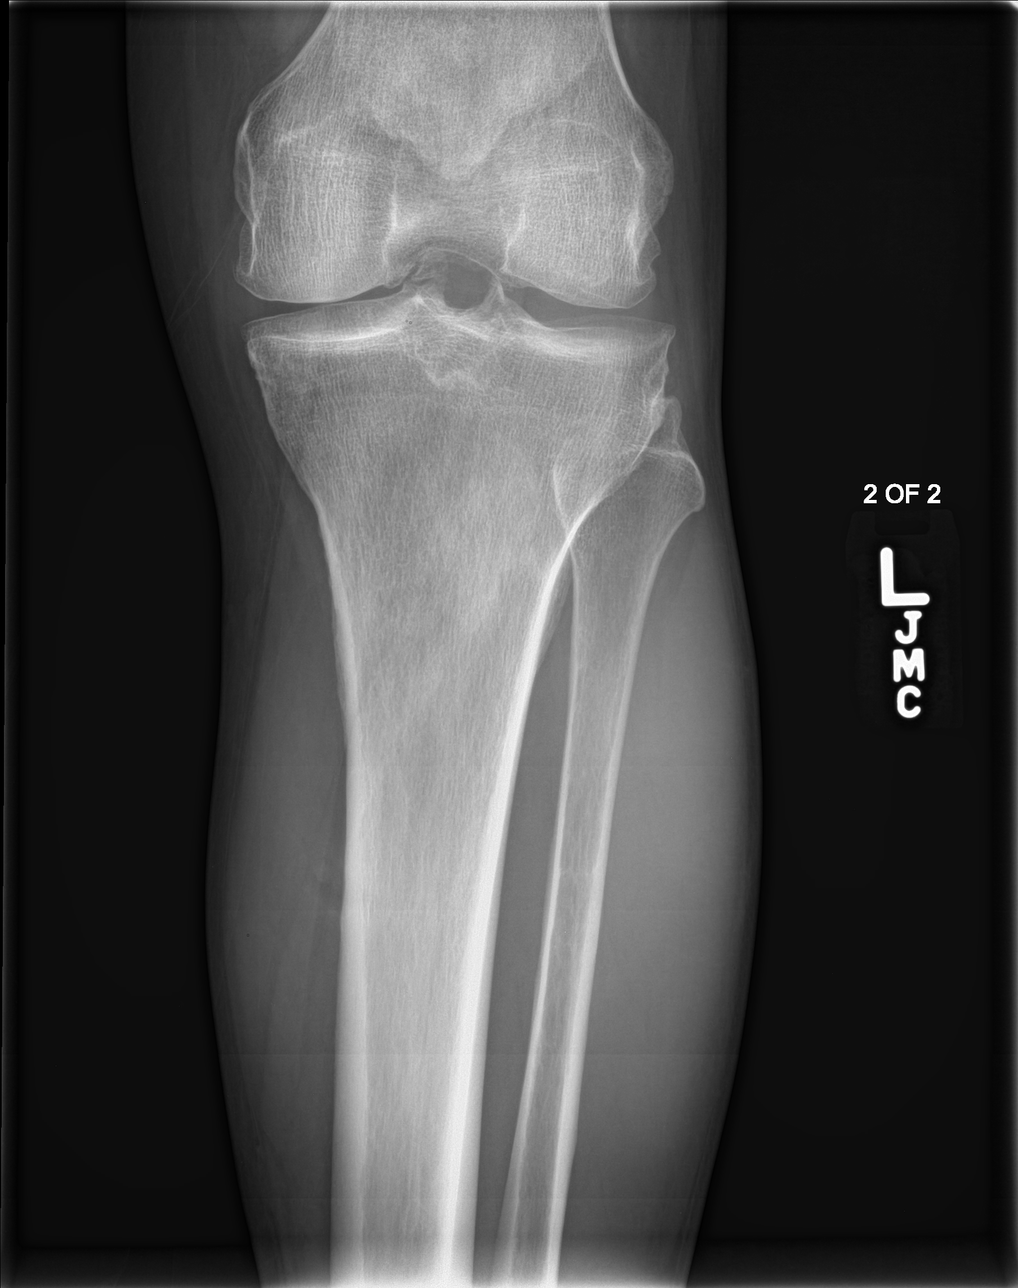

[tibia lat (1 of 2)]
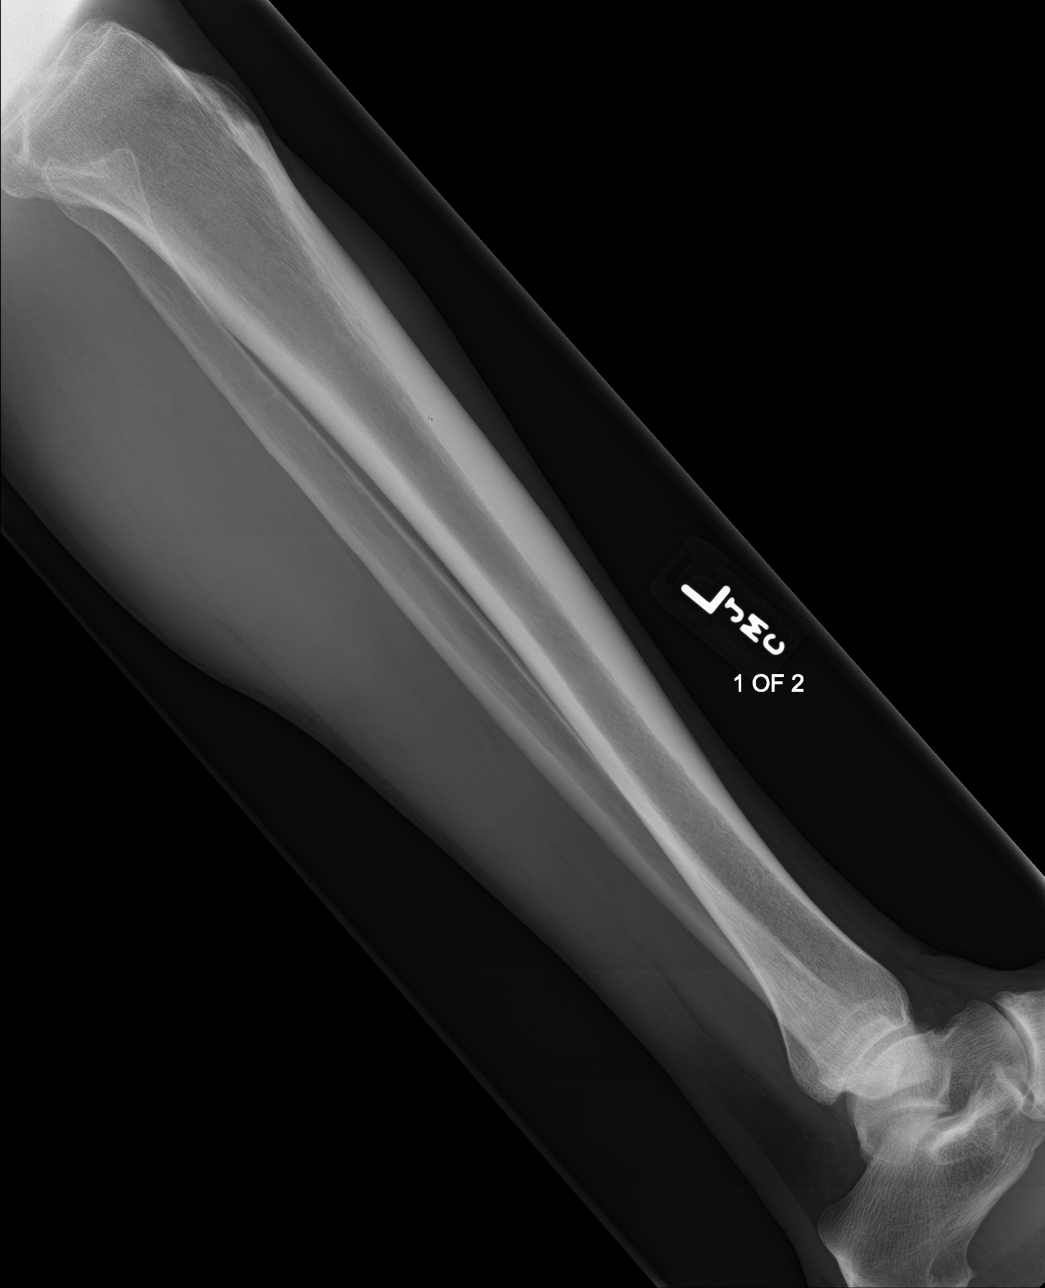

[tibia lat (2 of 2)]
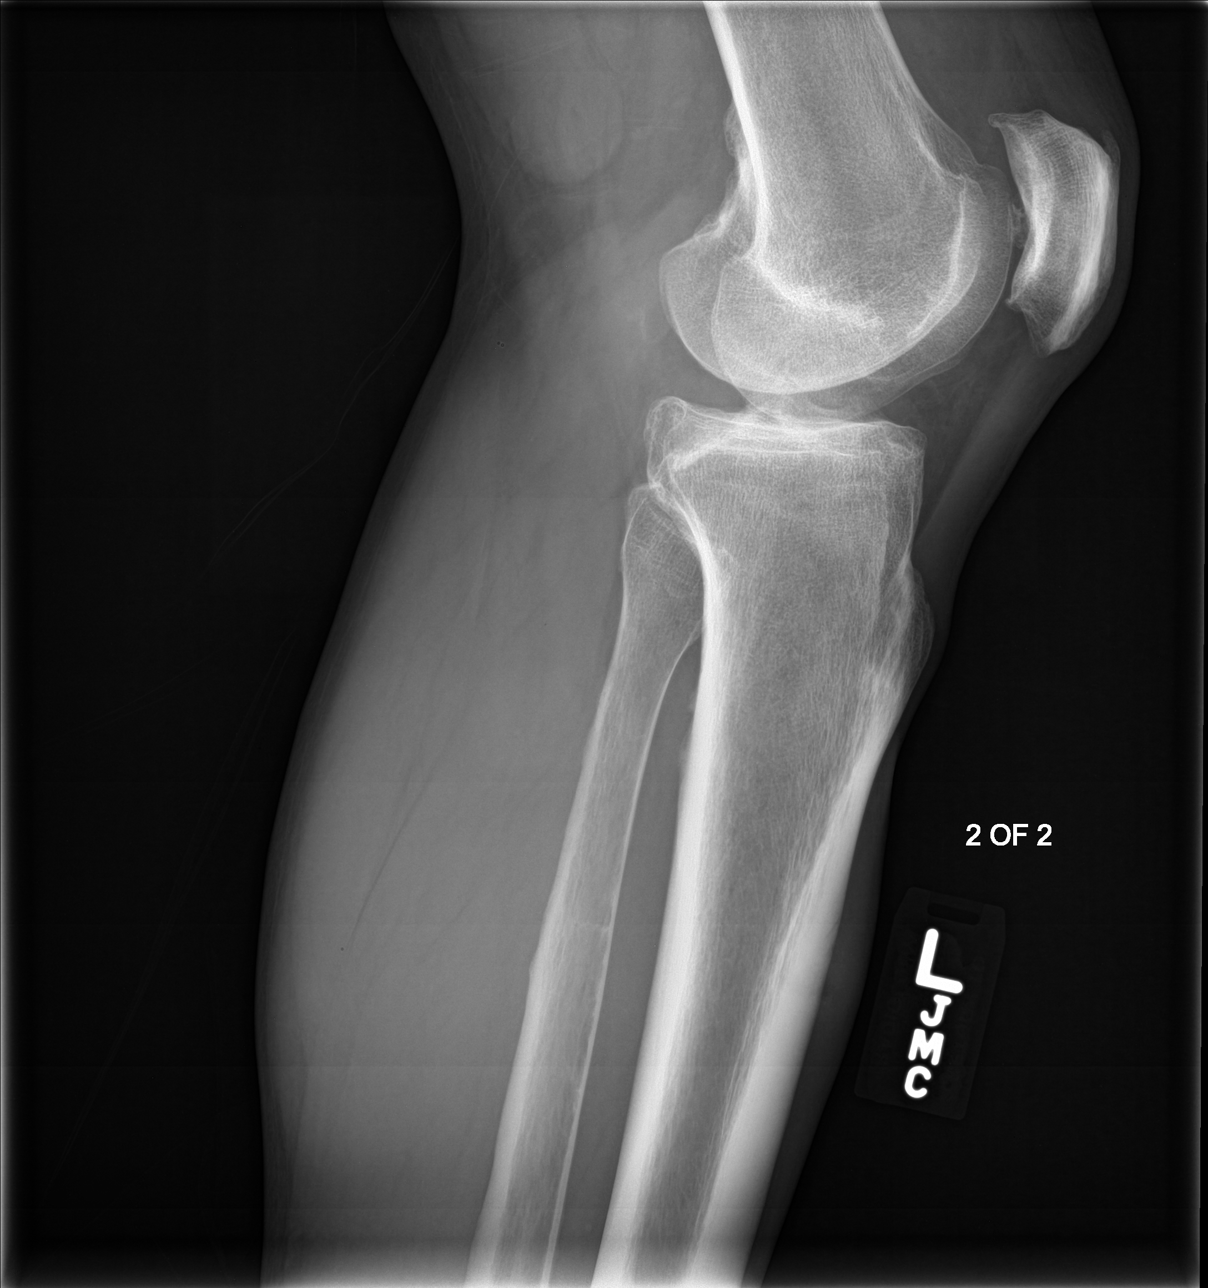

[4 of 4 positions shown; findings below may reference images not displayed]

FINDINGS: There is no evidence of fracture or other focal bone lesions. Soft
tissues are unremarkable. No lytic destruction is noted.
IMPRESSION: Negative.

## 2023-06-04 ENCOUNTER — Emergency Department (HOSPITAL_COMMUNITY)

## 2023-06-04 ENCOUNTER — Emergency Department (HOSPITAL_COMMUNITY)
Admission: EM | Admit: 2023-06-04 | Discharge: 2023-06-04 | Disposition: A | Discharge status: Home / Self Care | Attending: Emergency Medicine | Admitting: Emergency Medicine

## 2023-06-04 ENCOUNTER — Encounter (HOSPITAL_COMMUNITY): Payer: Self-pay | Admitting: Emergency Medicine

## 2023-06-04 ENCOUNTER — Other Ambulatory Visit: Payer: Self-pay

## 2023-06-04 DIAGNOSIS — R079 Chest pain, unspecified: Secondary | ICD-10-CM

## 2023-06-04 DIAGNOSIS — I10 Essential (primary) hypertension: Secondary | ICD-10-CM | POA: Diagnosis not present

## 2023-06-04 DIAGNOSIS — I2089 Other forms of angina pectoris: Secondary | ICD-10-CM | POA: Diagnosis not present

## 2023-06-04 DIAGNOSIS — Z79899 Other long term (current) drug therapy: Secondary | ICD-10-CM | POA: Diagnosis not present

## 2023-06-04 DIAGNOSIS — R0602 Shortness of breath: Secondary | ICD-10-CM | POA: Diagnosis not present

## 2023-06-04 LAB — CBC
HCT: 39.7 % (ref 39.0–52.0)
Hemoglobin: 13.9 g/dL (ref 13.0–17.0)
MCH: 32.3 pg (ref 26.0–34.0)
MCHC: 35 g/dL (ref 30.0–36.0)
MCV: 92.1 fL (ref 80.0–100.0)
Platelets: 264 10*3/uL (ref 150–400)
RBC: 4.31 MIL/uL (ref 4.22–5.81)
RDW: 13.2 % (ref 11.5–15.5)
WBC: 6.7 10*3/uL (ref 4.0–10.5)
nRBC: 0 % (ref 0.0–0.2)

## 2023-06-04 LAB — BASIC METABOLIC PANEL WITH GFR
Anion gap: 8 (ref 5–15)
BUN: 7 mg/dL (ref 6–20)
CO2: 23 mmol/L (ref 22–32)
Calcium: 8.6 mg/dL — ABNORMAL LOW (ref 8.9–10.3)
Chloride: 105 mmol/L (ref 98–111)
Creatinine, Ser: 0.97 mg/dL (ref 0.61–1.24)
GFR, Estimated: >60 mL/min (ref >60)
Glucose, Bld: 120 mg/dL — ABNORMAL HIGH (ref 70–99)
Potassium: 3.6 mmol/L (ref 3.5–5.1)
Sodium: 136 mmol/L (ref 135–145)

## 2023-06-04 LAB — TROPONIN I (HIGH SENSITIVITY)
Troponin I (High Sensitivity): 7 ng/L (ref <18)
Troponin I (High Sensitivity): 7 ng/L (ref <18)

## 2023-06-04 NOTE — ED Provider Notes (Signed)
 [place] EMERGENCY DEPARTMENT AT [facility] Provider Note   CSN: [id number] Arrival date & time: 06/04/23  1507     History  Chief Complaint  Patient presents with   Chest Pain   Gabriel Flowers is a 53 y.o. male who presents with concern for CP, left sided that radiates to the left arm with associated fatigue. 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 Pain is relieved with rest. Has never occurred at rest. Occurs only for the last month. No other aggravating or alleviating factors. Smokes 1/2 pack cigarettes per day, and 10 joints of marijuana per day. NO ETOH or other recreational substances. No syncope, N/V/D, or palpitations, No SOB.   [facility] with death of his son in 01/16/2023 secondary to heart attack. Hx of HTN, central retinal artery occlusion. No recent prolonged immobilization, surgery, or travel. No Hx of  DVT/PE.   HPI     Home Medications Prior to Admission medications   Medication Sig Start Date End Date Taking? Authorizing Provider  amLODipine (NORVASC) 10 MG tablet Take 10 mg by mouth daily.  05/08/18   [provider]  chlorhexidine (HIBICLENS) 4 % external liquid Apply topically daily as needed. Dilute 10-15 mL in water, Use daily when bathing for 1-2 weeks 10/15/20   Domenick Gong, MD  furosemide (LASIX) 40 MG tablet Take 40 mg by mouth daily.     [provider]  ibuprofen (ADVIL) 600 MG tablet Take 1 tablet (600 mg total) by mouth every 6 (six) hours as needed. 10/15/20   Domenick Gong, MD      Allergies    Patient has no known allergies.    Review of Systems   Review of Systems  Cardiovascular:  Positive for chest pain.    Physical Exam Updated Vital Signs BP (!) 122/98 (BP Location: Right Arm)   Pulse 69   Temp 98.5 F (36.9 C) (Oral)   Resp 14   Ht 5\' 9"  (1.753 m)   Wt 95.3 kg   SpO2 100%   BMI 31.01 kg/m  Physical Exam Vitals and nursing note reviewed.   Constitutional:      Appearance: He is not ill-appearing or toxic-appearing.  HENT:     Head: Normocephalic and atraumatic.     Mouth/Throat:     Mouth: Mucous membranes are moist.     Pharynx: No oropharyngeal exudate or posterior oropharyngeal erythema.  Eyes:     General:        Right eye: No discharge.        Left eye: No discharge.     Conjunctiva/sclera: Conjunctivae normal.  Cardiovascular:     Rate and Rhythm: Normal rate and regular rhythm.     Pulses: Normal pulses.     Heart sounds: Normal heart sounds. No murmur heard. Pulmonary:     Effort: Pulmonary effort is normal. No tachypnea, bradypnea, accessory muscle usage, prolonged expiration or respiratory distress.     Breath sounds: Normal breath sounds. No wheezing or rales.  Chest:     Chest wall: No mass, tenderness or edema.  Abdominal:     General: Bowel sounds are normal. There is no distension.     Palpations: Abdomen is soft.     Tenderness: There is no abdominal tenderness.  Musculoskeletal:        General: No deformity.     Cervical back: Neck supple.     Right lower leg:  No edema.     Left lower leg: No edema.  Skin:    General: Skin is warm and dry.     Capillary Refill: Capillary refill takes less than 2 seconds.  Neurological:     General: No focal deficit present.     Mental Status: He is alert and oriented to person, place, and time. Mental status is at baseline.  Psychiatric:        Mood and Affect: Mood normal.     ED Results / Procedures / Treatments   Labs (all labs ordered are listed, but only abnormal results are displayed) Labs Reviewed  BASIC METABOLIC PANEL WITH GFR - Abnormal; Notable for the following components:      Result Value   Glucose, Bld 120 (*)    Calcium 8.6 (*)    All other components within normal limits  CBC  TROPONIN I (HIGH SENSITIVITY)  TROPONIN I (HIGH SENSITIVITY)    EKG None  Radiology DG Chest 2 View Result Date: 06/04/2023 CLINICAL DATA:  Chest  pain, shortness of breath. EXAM: CHEST - 2 VIEW COMPARISON:  None Available. FINDINGS: The heart size and mediastinal contours are within normal limits. Both lungs are clear. The visualized skeletal structures are unremarkable. IMPRESSION: No active cardiopulmonary disease. Electronically Signed   By: Lupita Raider M.D.   On: 06/04/2023 17:44    Procedures Procedures    Medications Ordered in ED Medications - No data to display  ED Course/ Medical Decision Making/ A&P                                 Medical Decision Making 53 y/o male who presents with concern for CP.  VS normal on intake. Cardiopulmonary exam is reassuring, abdominal exam is benign. No LE edema.   DDX includes but is not limited to ACS, dysrhythmia, stable angina, MSK pain, PNA, PE, tamponade.   Amount and/or Complexity of Data Reviewed Labs: ordered.    Details: CBC, BMP reassuring, Trop negative x 2.  Radiology: ordered.    Details: CXR negative. ECG/medicine tests:     Details: EKG with NSR.    Clinical picture most consistent with stable angina. While clinical concern for emergent etiology this evening that would warrant further ED workup or inpatient management is low, high clinical suspicion CAD. Urgent cardiology referral placed, Strict return precautions  were discussed with the patient and his wife, [name], at the bedside.   Kemarion voiced understanding of his medical evaluation and treatment plan. Each of their questions answered to their expressed satisfaction.  Return precautions were given.  Patient is well-appearing, stable, and was discharged in good condition.  This chart was dictated using voice recognition software, Dragon. Despite the best efforts of this provider to proofread and correct errors, errors may still occur which can change documentation meaning.         Final Clinical Impression(s) / ED Diagnoses Final diagnoses:  Stable angina (HCC)  Chest pain, unspecified type    Rx  / DC Orders ED Discharge Orders          Ordered    Ambulatory referral to Cardiology       Comments: If you have not heard from the Cardiology office within the next 72 hours please call [phone].   06/04/23 2238              Sebrina Kessner, Eugene Gavia, PA-C 06/04/23 2348  Jacalyn Lefevre, MD 06/05/23 1620

## 2023-06-04 NOTE — ED Provider Triage Note (Signed)
 Emergency Medicine Provider Triage Evaluation Note  Gabriel Flowers , a 53 y.o. male  was evaluated in triage.  Pt complains of chest pain. Reports this has been ongoing for about 2 weeks. States that pain is primarily left sided with radiation towards the left shoulder. Denies any nausea, vomiting, or shortness of breath.  Review of Systems  Positive: As above Negative: As above  Physical Exam  BP 130/89 (BP Location: Right Arm)   Pulse 76   Temp 98.7 F (37.1 C)   Resp 18   Ht 5\' 9"  (1.753 m)   Wt 95.3 kg   SpO2 99%   BMI 31.01 kg/m  Gen:   Awake, no distress   Resp:  Normal effort  MSK:   Moves extremities without difficulty  Other:    Medical Decision Making  Medically screening exam initiated at 3:46 PM.  Appropriate orders placed.  Gabriel Flowers was informed that the remainder of the evaluation will be completed by another provider, this initial triage assessment does not replace that evaluation, and the importance of remaining in the ED until their evaluation is complete.     Smitty Knudsen, PA-C 06/04/23 1546

## 2023-06-04 NOTE — ED Triage Notes (Signed)
 Pt endorses CP x2 weeks to a month. Left sided that radiates to left shoulder. Endorses SOB. Hx of HTN but on medications. Denies cardiac hx.

## 2023-06-04 NOTE — Discharge Instructions (Signed)
 You were seen in the ER today for your chest pain.  Your workup in the ER tonight was reassuring, there is no evidence of heart attack today, however you do need to follow-up closely in the outpatient setting with cardiology.  Referral was placed today.  Please see the contact information below.  Call their office if have not heard from them by Monday at lunch.  Return to the ER if you develop any recurrent chest pain, if you pass out, develop any shortness of breath, or any other new severe symptoms.

## 2023-06-26 DIAGNOSIS — I10 Essential (primary) hypertension: Secondary | ICD-10-CM | POA: Insufficient documentation

## 2023-06-26 DIAGNOSIS — R079 Chest pain, unspecified: Secondary | ICD-10-CM | POA: Insufficient documentation

## 2023-06-29 ENCOUNTER — Emergency Department (HOSPITAL_COMMUNITY)

## 2023-06-29 ENCOUNTER — Other Ambulatory Visit: Payer: Self-pay

## 2023-06-29 ENCOUNTER — Observation Stay (HOSPITAL_COMMUNITY)
Admission: EM | Admit: 2023-06-29 | Discharge: 2023-06-30 | Disposition: A | Discharge status: Home / Self Care | Attending: Internal Medicine | Admitting: Internal Medicine

## 2023-06-29 ENCOUNTER — Encounter (HOSPITAL_COMMUNITY): Payer: Self-pay | Admitting: Emergency Medicine

## 2023-06-29 ENCOUNTER — Ambulatory Visit

## 2023-06-29 VITALS — BP 120/78 | HR 70 | Ht 69.0 in | Wt 200.4 lb

## 2023-06-29 DIAGNOSIS — R0789 Other chest pain: Secondary | ICD-10-CM | POA: Insufficient documentation

## 2023-06-29 DIAGNOSIS — I200 Unstable angina: Principal | ICD-10-CM | POA: Insufficient documentation

## 2023-06-29 DIAGNOSIS — F17210 Nicotine dependence, cigarettes, uncomplicated: Secondary | ICD-10-CM | POA: Diagnosis not present

## 2023-06-29 DIAGNOSIS — I10 Essential (primary) hypertension: Secondary | ICD-10-CM | POA: Diagnosis not present

## 2023-06-29 DIAGNOSIS — Z79899 Other long term (current) drug therapy: Secondary | ICD-10-CM | POA: Insufficient documentation

## 2023-06-29 DIAGNOSIS — Z7982 Long term (current) use of aspirin: Secondary | ICD-10-CM | POA: Insufficient documentation

## 2023-06-29 DIAGNOSIS — E785 Hyperlipidemia, unspecified: Secondary | ICD-10-CM | POA: Insufficient documentation

## 2023-06-29 HISTORY — DX: Other chest pain: R07.89

## 2023-06-29 LAB — CBC
HCT: 35.4 % — ABNORMAL LOW (ref 39.0–52.0)
Hemoglobin: 12.2 g/dL — ABNORMAL LOW (ref 13.0–17.0)
MCH: 31.9 pg (ref 26.0–34.0)
MCHC: 34.5 g/dL (ref 30.0–36.0)
MCV: 92.7 fL (ref 80.0–100.0)
Platelets: 258 10*3/uL (ref 150–400)
RBC: 3.82 MIL/uL — ABNORMAL LOW (ref 4.22–5.81)
RDW: 13.5 % (ref 11.5–15.5)
WBC: 7.3 10*3/uL (ref 4.0–10.5)
nRBC: 0 % (ref 0.0–0.2)

## 2023-06-29 LAB — BASIC METABOLIC PANEL WITH GFR
Anion gap: 9 (ref 5–15)
BUN: 9 mg/dL (ref 6–20)
CO2: 25 mmol/L (ref 22–32)
Calcium: 8.6 mg/dL — ABNORMAL LOW (ref 8.9–10.3)
Chloride: 105 mmol/L (ref 98–111)
Creatinine, Ser: 0.99 mg/dL (ref 0.61–1.24)
GFR, Estimated: >60 mL/min (ref >60)
Glucose, Bld: 106 mg/dL — ABNORMAL HIGH (ref 70–99)
Potassium: 3.6 mmol/L (ref 3.5–5.1)
Sodium: 139 mmol/L (ref 135–145)

## 2023-06-29 LAB — TROPONIN I (HIGH SENSITIVITY)
Troponin I (High Sensitivity): 6 ng/L (ref <18)
Troponin I (High Sensitivity): 5 ng/L (ref <18)

## 2023-06-29 LAB — MAGNESIUM: Magnesium: 1.9 mg/dL (ref 1.7–2.4)

## 2023-06-29 MED ORDER — NITROGLYCERIN 0.4 MG SL SUBL: 0.4000 mg | SUBLINGUAL_TABLET | SUBLINGUAL | Status: DC | PRN

## 2023-06-29 MED ORDER — HEPARIN BOLUS VIA INFUSION
4000.0000 [IU] | Freq: Once | INTRAVENOUS | Status: AC
  Administered 2023-06-29: 4000 [IU] via INTRAVENOUS
  Filled 2023-06-29: qty 4000

## 2023-06-29 MED ORDER — MORPHINE SULFATE (PF) 4 MG/ML IV SOLN
4.0000 mg | Freq: Once | INTRAVENOUS | Status: AC
  Administered 2023-06-29: 4 mg via INTRAVENOUS
  Filled 2023-06-29: qty 1

## 2023-06-29 MED ORDER — TAMSULOSIN HCL 0.4 MG PO CAPS
0.4000 mg | ORAL_CAPSULE | Freq: Every day | ORAL | Status: DC
  Administered 2023-06-30: 0.4 mg via ORAL
  Filled 2023-06-29: qty 1

## 2023-06-29 MED ORDER — OXYCODONE HCL 5 MG PO TABS: 5.0000 mg | ORAL_TABLET | ORAL | Status: DC | PRN

## 2023-06-29 MED ORDER — ACETAMINOPHEN 325 MG PO TABS: 650.0000 mg | ORAL_TABLET | ORAL | Status: DC | PRN

## 2023-06-29 MED ORDER — ATORVASTATIN CALCIUM 80 MG PO TABS
80.0000 mg | ORAL_TABLET | Freq: Every day | ORAL | Status: DC
  Administered 2023-06-30: 80 mg via ORAL
  Filled 2023-06-29: qty 1

## 2023-06-29 MED ORDER — LOSARTAN POTASSIUM 25 MG PO TABS
25.0000 mg | ORAL_TABLET | Freq: Every day | ORAL | Status: DC
  Administered 2023-06-30: 25 mg via ORAL
  Filled 2023-06-29: qty 1

## 2023-06-29 MED ORDER — METOPROLOL TARTRATE 25 MG PO TABS
12.5000 mg | ORAL_TABLET | Freq: Two times a day (BID) | ORAL | Status: DC
Start: 2023-06-29 — End: 2023-07-01

## 2023-06-29 MED ORDER — ASPIRIN 81 MG PO TBEC
81.0000 mg | DELAYED_RELEASE_TABLET | Freq: Every day | ORAL | Status: DC
  Administered 2023-06-30: 81 mg via ORAL
  Filled 2023-06-29: qty 1

## 2023-06-29 MED ORDER — POTASSIUM CHLORIDE 2 MEQ/ML IV SOLN
INTRAVENOUS | Status: AC
  Filled 2023-06-29: qty 1000

## 2023-06-29 MED ORDER — HEPARIN (PORCINE) 25000 UT/250ML-% IV SOLN
1100.0000 [IU]/h | INTRAVENOUS | Status: DC
  Administered 2023-06-29: 1100 [IU]/h via INTRAVENOUS
  Filled 2023-06-29: qty 250

## 2023-06-29 MED ORDER — ONDANSETRON HCL 4 MG/2ML IJ SOLN: 4.0000 mg | Freq: Four times a day (QID) | INTRAMUSCULAR | Status: DC | PRN

## 2023-06-29 MED ORDER — ASPIRIN 81 MG PO CHEW
243.0000 mg | CHEWABLE_TABLET | Freq: Once | ORAL | Status: AC
  Administered 2023-06-29: 243 mg via ORAL
  Filled 2023-06-29: qty 3

## 2023-06-29 NOTE — Progress Notes (Signed)
 Report to Carolyn Cisco, RN at [facility] ED.

## 2023-06-29 NOTE — Progress Notes (Signed)
 PHARMACY - ANTICOAGULATION CONSULT NOTE  Pharmacy Consult for heparin  Indication: chest pain/ACS  No Known Allergies  Patient Measurements: Height: 5\' 9"  (175.3 cm) Weight: 92.5 kg (204 lb) IBW/kg (Calculated) : 70.7 HEPARIN DW (KG): 89.6  Vital Signs: Temp: 97.9 F (36.6 C) (05/05 1718) Temp Source: Oral (05/05 1718) BP: 127/88 (05/05 1718) Pulse Rate: 78 (05/05 1718)  Labs: Recent Labs    06/29/23 1732 06/29/23 1932  HGB 12.2*  --   HCT 35.4*  --   PLT 258  --   CREATININE 0.99  --   TROPONINIHS 6 5    Estimated Creatinine Clearance: 94.7 mL/min (by C-G formula based on SCr of 0.99 mg/dL).   Medical History: Past Medical History:  Diagnosis Date   Acute glaucoma of right eye 08/24/2018   Central retinal artery occlusion of right eye 08/23/2018   Chest pain    HTN (hypertension) 08/23/2018   Hypertension    Assessment: Patient admitted with CC of abnormal EKG, no chest pain currently but cardiology to potential do cath in the AM. HgB 12.2 and PLTs 258. Not on anticoagulation PTA. Pharmacy consulted to dose heparin gtt.   Goal of Therapy:  Heparin level 0.3-0.7 units/ml Monitor platelets by anticoagulation protocol: Yes   Plan:  Give 4000 units bolus x 1 Start heparin infusion at 1100 units/hr Check anti-Xa level in 6 hours and daily while on heparin Continue to monitor H&H and platelets  Mamie Searles, PharmD, BCCCP  06/29/2023,9:09 PM

## 2023-06-29 NOTE — Patient Instructions (Signed)
 GO directly to [facility] ED  Medication Instructions:  Your physician recommends that you continue on your current medications as directed. Please refer to the Current Medication list given to you today.  *If you need a refill on your cardiac medications before your next appointment, please call your pharmacy*   Lab Work: None ordered If you have labs (blood work) drawn today and your tests are completely normal, you will receive your results only by: MyChart Message (if you have MyChart) OR A paper copy in the mail If you have any lab test that is abnormal or we need to change your treatment, we will call you to review the results.   Testing/Procedures: None ordered   Follow-Up: At [facility], you and your health needs are our priority.  As part of our continuing mission to provide you with exceptional heart care, we have created designated Provider Care Teams.  These Care Teams include your primary Cardiologist (physician) and Advanced Practice Providers (APPs -  Physician Assistants and Nurse Practitioners) who all work together to provide you with the care you need, when you need it.  We recommend signing up for the patient portal called "MyChart".  Sign up information is provided on this After Visit Summary.  MyChart is used to connect with patients for Virtual Visits (Telemedicine).  Patients are able to view lab/test results, encounter notes, upcoming appointments, etc.  Non-urgent messages can be sent to your provider as well.   To learn more about what you can do with MyChart, go to [email or web address].    Your next appointment:   3 week(s)  The format for your next appointment:   In Person  Provider:   Bertha Broad, MD    Other Instructions none  Important Information About Sugar

## 2023-06-29 NOTE — Assessment & Plan Note (Signed)
 Well-controlled today with systolic 120/78 mmHg. Losartan 25 mg once daily for the past 3 weeks.  Tolerating well.

## 2023-06-29 NOTE — ED Triage Notes (Signed)
 Pt here from heart care with possible abnormal EKG , no chest at presents but cards will possible do cath in morning

## 2023-06-29 NOTE — Progress Notes (Signed)
 Cardiology Consultation:    Date:  06/29/2023   ID:  Gabriel Flowers, DOB 1971/02/23, MRN [id number]  PCP:  [facility]  Cardiologist:  Daymon Evans Royal Beirne, MD   Referring MD: [facility], [facility]*   No chief complaint on file.    ASSESSMENT AND PLAN:   Gabriel Flowers 53 year old male patient with history of hypertension, BPH, smokes half pack tobacco per day, stopped following up with PCP for couple years since 2022, most recently Problem List Items Addressed This Visit     Hypertension   Well-controlled today with systolic 120/78 mmHg. Losartan 25 mg once daily for the past 3 weeks.  Tolerating well.       Relevant Medications   losartan (COZAAR) 25 MG tablet   Other Relevant Orders   EKG 12-Lead (Completed)   Unstable angina (HCC) - Primary   Overall her symptoms and chest pressure worse today in comparison to last couple days and associated with new T wave inversions in inferior leads. High suspicion for unstable angina. Recommended further evaluation promptly in the emergency room to assess for acute coronary syndrome with serial troponins. If troponins negative would still recommend admission overnight and expedited evaluation with invasive cardiac catheterization coronary angiogram to rule out any significant obstructive coronary artery disease.  Continue aspirin  81 mg once daily.  Given the option to call 911 to bring him to the ER he prefers having brought to the ER by his wife. He preferred to go to the ER in private vehicle, will notify ER team at  [facility] where they will be heading to.  Also called and notified the cardiac Cath Lab team at [facility] about his pending arrival to the ER.       Relevant Medications   losartan (COZAAR) 25 MG tablet   Return to clinic tentatively in 2 to 3 weeks based on evaluation and ER.  History of Present Illness:    Gabriel Flowers is a 53 y.o. male who is being seen today for the  evaluation of chest pain at the request of [facility], [employer]*.  Here for the visit today accompanied by his wife.  Pleasant gentleman works in [employer]. He stopped taking all his medications and stopped following up with a primary care provider for over 2 years.  Smokes tobacco up to half pack a day, BPH, hypertension.  Mentions in relative good health at baseline until later half of March.  Since then he has been having symptoms of chest pain. Was recently in the emergency room 06-04-2023 for evaluation of chest pain associated with radiating to the left arm and fatigue.  High-sensitivity troponin I was unremarkable 7, 7.  Hemodynamically stable with blood pressure mildly elevated 122/98 mmHg.  EKG without any marked abnormalities during his ER visit.  He was discharged home subsequently. He was started on losartan 25 mg once daily and restarted on tamsulosin for BPH and discharged to follow-up with cardiology as outpatient.  Intermittent symptomatic.  Mentions keeps having intermittent chest discomfort describes as heaviness and pressure over the radiating to the left.  Associated with significant tiredness and fatigue.  In fact today in the exam room he was laying down on the exam table supine and napping due to tiredness and mentions his had ongoing chest discomfort this morning worse over the last couple days.   Continues to smoke tobacco up to half pack a day.  He has been taking losartan 25 mg once  daily and that he was discharged on consistently along with tamsulosin. He has also been taking vitamin D supplements and B supplements   EKG in clinic today shows sinus rhythm heart rate 70/min, PR interval 134 ms, T wave inversions in inferior leads new in comparison to prior EKG from June 04, 2023.  Blood work from 06-16-2023 total cholesterol 195, triglycerides 106, HDL 60, LDL 114. TSH normal 2.47. CBC unremarkable. CMP with BUN 10, creatinine 0.97, Normal  transaminases. Alkaline phosphatase mildly elevated 109.   Past Medical History:  Diagnosis Date   Acute glaucoma of right eye 08/24/2018   Central retinal artery occlusion of right eye 08/23/2018   Chest pain    HTN (hypertension) 08/23/2018   Hypertension     Past Surgical History:  Procedure Laterality Date   APPENDECTOMY      Current Medications: Current Meds  Medication Sig   chlorhexidine (HIBICLENS ) 4 % external liquid Apply topically daily as needed. Dilute 10-15 mL in water, Use daily when bathing for 1-2 weeks   ibuprofen  (ADVIL ) 600 MG tablet Take 1 tablet (600 mg total) by mouth every 6 (six) hours as needed.   losartan (COZAAR) 25 MG tablet Take 25 mg by mouth daily.   tamsulosin (FLOMAX) 0.4 MG CAPS capsule Take 0.4 mg by mouth daily.   [DISCONTINUED] amLODipine (NORVASC) 10 MG tablet Take 10 mg by mouth daily.    [DISCONTINUED] furosemide (LASIX) 40 MG tablet Take 40 mg by mouth daily.      Allergies:   Patient has no known allergies.   Social History   Socioeconomic History   Marital status: Single    Spouse name: Not on file   Number of children: Not on file   Years of education: Not on file   Highest education level: Not on file  Occupational History   Not on file  Tobacco Use   Smoking status: Every Day    Types: Cigarettes   Smokeless tobacco: Never  Vaping Use   Vaping status: Never Used  Substance and Sexual Activity   Alcohol use: No   Drug use: Yes    Types: Marijuana   Sexual activity: Yes  Other Topics Concern   Not on file  Social History Narrative   Not on file   Social Drivers of Health   Financial Resource Strain: Low Risk  (08/23/2018)   Overall Financial Resource Strain (CARDIA)    Difficulty of Paying Living Expenses: Not hard at all  Food Insecurity: Low Risk  (06/16/2023)   Received from [facility]   Food Insecurity    Within the past 12 months, did the food you bought just not last and you didn't have  money to get more?: No    Within the past 12 months, did you worry that your food would run out before you got money to buy more?: No  Transportation Needs: Low Risk  (06/16/2023)   Received from [facility]   Transportation Needs    Within the past 12 months, has a lack of transportation kept you from medical appointments or from doing things needed for daily living?: No  Physical Activity: Unknown (08/23/2018)   Exercise Vital Sign    Days of Exercise per Week: Patient declined    Minutes of Exercise per Session: Patient declined  Stress: No Stress Concern Present (08/23/2018)   [employer] of Occupational Health - Occupational Stress Questionnaire    Feeling of Stress : Only a little  Social Connections: Unknown (08/23/2018)   Social Connection and Isolation Panel [NHANES]    Frequency of Communication with Friends and Family: Patient declined    Frequency of Social Gatherings with Friends and Family: Patient declined    Attends Religious Services: Patient declined    Database administrator or Organizations: Patient declined    Attends Banker Meetings: Patient declined    Marital Status: Patient declined     Family History: The patient's family history includes Osteoarthritis in his father and mother. ROS:   Please see the history of present illness.    All 14 point review of systems negative except as described per history of present illness.  EKGs/Labs/Other Studies Reviewed:    The following studies were reviewed today:   EKG:  EKG Interpretation Date/Time:  Monday Jun 29 2023 14:31:13 EDT Ventricular Rate:  70 PR Interval:  134 QRS Duration:  86 QT Interval:  370 QTC Calculation: 399 R Axis:   58  Text Interpretation: Normal sinus rhythm T wave abnormality, consider inferior ischemia Abnormal ECG When compared with ECG of 04-Jun-2023 15:26, T wave inversion now evident in Inferior leads Confirmed by Bertha Broad reddy [phone]) on  06/29/2023 2:54:18 PM    Recent Labs: 06/04/2023: BUN 7; Creatinine, Ser 0.97; Hemoglobin 13.9; Platelets 264; Potassium 3.6; Sodium 136  Recent Lipid Panel No results found for: "CHOL", "TRIG", "HDL", "CHOLHDL", "VLDL", "LDLCALC", "LDLDIRECT"  Physical Exam:    VS:  BP 120/78   Pulse 70   Ht 5\' 9"  (1.753 m)   Wt 200 lb 6.4 oz (90.9 kg)   SpO2 93%   BMI 29.59 kg/m     Wt Readings from Last 3 Encounters:  06/29/23 200 lb 6.4 oz (90.9 kg)  06/04/23 210 lb (95.3 kg)  02/08/22 239 lb (108.4 kg)     GENERAL:  Well nourished, well developed in no acute distress NECK: No JVD; No carotid bruits CARDIAC: RRR, S1 and S2 present, no murmurs, no rubs, no gallops CHEST:  Clear to auscultation without rales, wheezing or rhonchi  Extremities: No pitting pedal edema. Pulses bilaterally symmetric with radial 2+ and dorsalis pedis 2+ NEUROLOGIC:  Alert and oriented x 3  Medication Adjustments/Labs and Tests Ordered: Current medicines are reviewed at length with the patient today.  Concerns regarding medicines are outlined above.  Orders Placed This Encounter  Procedures   EKG 12-Lead   No orders of the defined types were placed in this encounter.   Signed, Lura Sallies, MD, MPH, [facility]. 06/29/2023 3:47 PM    [facility]

## 2023-06-29 NOTE — ED Provider Notes (Signed)
 [place] EMERGENCY DEPARTMENT AT [facility] Provider Note   CSN: [id number] Arrival date & time: 06/29/23  1714     History {Add pertinent medical, surgical, social history, OB history to HPI:1} No chief complaint on file.   Gabriel Flowers is a 53 y.o. male.  With a history of hypertension, and tobacco use who presents to the ED for chest pain.  He has experienced ongoing chest pain which is a pressure quality over the last couple weeks.  Pain made worse with exertion especially climbing stairs.  Localized over midsternal region with radiation to the left upper extremity.  For this reason was seen by cardiology in the office today (Dr.Madireddy) who appreciated new EKG changes from prior (new T wave inversions in inferior leads) and directed him to the ED for further evaluation given concern for unstable angina with EKG changes.  Patient does note some chest pressure currently.  Had 81 mg aspirin  earlier today.  No shortness of breath nausea vomiting fevers or chills.  Positive family history of MI including father, brother and son.  HPI     Home Medications Prior to Admission medications   Medication Sig Start Date End Date Taking? Authorizing Provider  chlorhexidine (HIBICLENS ) 4 % external liquid Apply topically daily as needed. Dilute 10-15 mL in water, Use daily when bathing for 1-2 weeks 10/15/20   Ethlyn Herd, MD  ibuprofen  (ADVIL ) 600 MG tablet Take 1 tablet (600 mg total) by mouth every 6 (six) hours as needed. 10/15/20   Mortenson, Ashley, MD  losartan (COZAAR) 25 MG tablet Take 25 mg by mouth daily. 06/16/23   [provider]  tamsulosin (FLOMAX) 0.4 MG CAPS capsule Take 0.4 mg by mouth daily. 06/16/23   [provider]      Allergies    Patient has no known allergies.    Review of Systems   Review of Systems  Physical Exam Updated Vital Signs BP 127/88 (BP Location: Right Arm)   Pulse 78   Temp 97.9 F (36.6 C) (Oral)   Resp 17    SpO2 97%  Physical Exam Vitals and nursing note reviewed.  HENT:     Head: Normocephalic and atraumatic.  Eyes:     Pupils: Pupils are equal, round, and reactive to light.  Cardiovascular:     Rate and Rhythm: Normal rate and regular rhythm.  Pulmonary:     Effort: Pulmonary effort is normal.     Breath sounds: Normal breath sounds.  Abdominal:     Palpations: Abdomen is soft.     Tenderness: There is no abdominal tenderness.  Skin:    General: Skin is warm and dry.  Neurological:     Mental Status: He is alert.  Psychiatric:        Mood and Affect: Mood normal.     ED Results / Procedures / Treatments   Labs (all labs ordered are listed, but only abnormal results are displayed) Labs Reviewed  BASIC METABOLIC PANEL WITH GFR  CBC  MAGNESIUM   TROPONIN I (HIGH SENSITIVITY)    EKG None  Radiology No results found.  Procedures Procedures  {Document cardiac monitor, telemetry assessment procedure when appropriate:1}  Medications Ordered in ED Medications  aspirin  chewable tablet 243 mg (has no administration in time range)    ED Course/ Medical Decision Making/ A&P   {   Click here for ABCD2, HEART and other calculatorsREFRESH Note before signing :1}  Medical Decision Making 53 year old male with history as above presenting from cardiology office given concern for unstable angina with new T wave inversions in inferior leads on EKG today.  2 weeks of ongoing chest pain worsened with exertion not improving entirely with rest.  Takes daily aspirin .  2 Tylenol  earlier.  No other medications prior to arrival.  Continuing to endorse active chest pain on my assessment.  Hemodynamically stable.  Normal sinus rhythm.  Will obtain cardiac workup including troponin, delta troponin, CBC, metabolic panel magnesium  and continue to monitor on telemetry.  Will obtain serial EKGs and talk over with cardiology.  Plan for admission with cardiac  catheterization either tomorrow morning or sooner should this raise concern for an acute event based on EKG changes and serial troponin.  Amount and/or Complexity of Data Reviewed Labs: ordered. Radiology: ordered.  Risk OTC drugs.     {Document critical care time when appropriate:1} {Document review of labs and clinical decision tools ie heart score, Chads2Vasc2 etc:1}  {Document your independent review of radiology images, and any outside records:1} {Document your discussion with family members, caretakers, and with consultants:1} {Document social determinants of health affecting pt's care:1} {Document your decision making why or why not admission, treatments were needed:1} Final Clinical Impression(s) / ED Diagnoses Final diagnoses:  None    Rx / DC Orders ED Discharge Orders     None

## 2023-06-29 NOTE — H&P (Signed)
 History and Physical    KEIDYN FAGG OZH:[id number] DOB: 08/28/1970 DOA: 06/29/2023  PCP: [facility]   Patient coming from: Home   Chief Complaint: Chest pain   HPI: KEITHON CAMINITI is a 53 y.o. male with medical history significant for hypertension and central retinal artery occlusion who presents with chest pain.  He has been experiencing worsening chest pressure, particularly over the last couple days.  It tends to develop with exertion and eases off over the course of an hour or so with rest.  He has had increased fatigue associated with this.  He was seen for this in the cardiology clinic today where he was noted to have new T wave inversions involving leads III and aVF.  He was sent to the emergency department to expedite evaluation with cardiac catheterization.  ED Course: Upon arrival to the ED, patient is found to be afebrile and saturating mid 90s on room air with normal HR and stable BP.  EKG demonstrates a normal sinus rhythm with resolution of previously seen T wave inversions.  Labs are most notable for creatinine 0.99, potassium 3.6, normal WBC, hemoglobin 12.2, and normal troponin x 2.  No acute findings noted on chest x-ray.  Patient was treated with 324 aspirin  and morphine in the ED.  Review of Systems:  All other systems reviewed and apart from HPI, are negative.  Past Medical History:  Diagnosis Date   Acute glaucoma of right eye 08/24/2018   Central retinal artery occlusion of right eye 08/23/2018   Chest pain    HTN (hypertension) 08/23/2018   Hypertension     Past Surgical History:  Procedure Laterality Date   APPENDECTOMY      Social History:   reports that he has been smoking cigarettes. He has never used smokeless tobacco. He reports current drug use. Drug: Marijuana. He reports that he does not drink alcohol.  No Known Allergies  Family History  Problem Relation Age of Onset   Osteoarthritis Mother    Osteoarthritis Father       Prior to Admission medications   Medication Sig Start Date End Date Taking? Authorizing Provider  chlorhexidine (HIBICLENS ) 4 % external liquid Apply topically daily as needed. Dilute 10-15 mL in water, Use daily when bathing for 1-2 weeks 10/15/20   Ethlyn Herd, MD  ibuprofen  (ADVIL ) 600 MG tablet Take 1 tablet (600 mg total) by mouth every 6 (six) hours as needed. 10/15/20   Mortenson, Ashley, MD  losartan (COZAAR) 25 MG tablet Take 25 mg by mouth daily. 06/16/23   [provider]  tamsulosin (FLOMAX) 0.4 MG CAPS capsule Take 0.4 mg by mouth daily. 06/16/23   [provider]    Physical Exam: Vitals:   06/29/23 1718 06/29/23 1811  BP: 127/88   Pulse: 78   Resp: 17   Temp: 97.9 F (36.6 C)   TempSrc: Oral   SpO2: 97%   Weight:  92.5 kg  Height:  5\' 9"  (1.753 m)    Constitutional: NAD, no pallor   Eyes: PERTLA, lids and conjunctivae normal ENMT: Mucous membranes are moist. Posterior pharynx clear of any exudate or lesions.   Neck: supple, no masses  Respiratory: no wheezing, no crackles. No accessory muscle use.  Cardiovascular: S1 & S2 heard, regular rate and rhythm. No extremity edema.  Abdomen: No distension, no tenderness, soft. Bowel sounds active.  Musculoskeletal: no clubbing / cyanosis. No joint deformity upper and lower extremities.   Skin: no significant rashes,  lesions, ulcers. Warm, dry, well-perfused. Neurologic: CN 2-12 grossly intact. Moving all extremities. Alert and oriented.  Psychiatric: Calm. Cooperative.    Labs and Imaging on Admission: I have personally reviewed following labs and imaging studies  CBC: Recent Labs  Lab 06/29/23 1732  WBC 7.3  HGB 12.2*  HCT 35.4*  MCV 92.7  PLT 258   Basic Metabolic Panel: Recent Labs  Lab 06/29/23 1732  NA 139  K 3.6  CL 105  CO2 25  GLUCOSE 106*  BUN 9  CREATININE 0.99  CALCIUM 8.6*  MG 1.9   GFR: Estimated Creatinine Clearance: 94.7 mL/min (by C-G formula based on SCr  of 0.99 mg/dL). Liver Function Tests: No results for input(s): "AST", "ALT", "ALKPHOS", "BILITOT", "PROT", "ALBUMIN" in the last 168 hours. No results for input(s): "LIPASE", "AMYLASE" in the last 168 hours. No results for input(s): "AMMONIA" in the last 168 hours. Coagulation Profile: No results for input(s): "INR", "PROTIME" in the last 168 hours. Cardiac Enzymes: No results for input(s): "CKTOTAL", "CKMB", "CKMBINDEX", "TROPONINI" in the last 168 hours. BNP (last 3 results) No results for input(s): "PROBNP" in the last 8760 hours. HbA1C: No results for input(s): "HGBA1C" in the last 72 hours. CBG: No results for input(s): "GLUCAP" in the last 168 hours. Lipid Profile: No results for input(s): "CHOL", "HDL", "LDLCALC", "TRIG", "CHOLHDL", "LDLDIRECT" in the last 72 hours. Thyroid Function Tests: No results for input(s): "TSH", "T4TOTAL", "FREET4", "T3FREE", "THYROIDAB" in the last 72 hours. Anemia Panel: No results for input(s): "VITAMINB12", "FOLATE", "FERRITIN", "TIBC", "IRON", "RETICCTPCT" in the last 72 hours. Urine analysis:    Component Value Date/Time   COLORURINE YELLOW 02/08/2022 1149   APPEARANCEUR CLEAR 02/08/2022 1149   LABSPEC 1.025 02/08/2022 1149   PHURINE 6.5 02/08/2022 1149   GLUCOSEU NEGATIVE 02/08/2022 1149   HGBUR NEGATIVE 02/08/2022 1149   BILIRUBINUR SMALL (A) 02/08/2022 1149   KETONESUR NEGATIVE 02/08/2022 1149   PROTEINUR NEGATIVE 02/08/2022 1149   NITRITE NEGATIVE 02/08/2022 1149   LEUKOCYTESUR NEGATIVE 02/08/2022 1149   Sepsis Labs: @LABRCNTIP (procalcitonin:4,lacticidven:4) )No results found for this or any previous visit (from the past 240 hours).   Radiological Exams on Admission: DG Chest 2 View Result Date: 06/29/2023 CLINICAL DATA:  Chest pain. EXAM: CHEST - 2 VIEW COMPARISON:  06/04/2023 FINDINGS: The cardiomediastinal contours are normal. The lungs are clear. Pulmonary vasculature is normal. No consolidation, pleural effusion, or  pneumothorax. No acute osseous abnormalities are seen. IMPRESSION: No acute findings.  Stable radiographic appearance of the chest. Electronically Signed   By: Chadwick Colonel M.D.   On: 06/29/2023 18:16    EKG: Independently reviewed. Normal sinus rhythm.   Assessment/Plan  1. Unstable angina  - Sent from cardiology clinic for management of unstable angina  - Start IV heparin, high-intensity statin, beta-blocker, continue ASA, use NTG as-needed    DVT prophylaxis: IV heparin  Code Status: Full  Level of Care: Level of care: Telemetry Cardiac Family Communication: Significant other in bed with patient throughout interview   Disposition Plan:  Patient is from: home  Anticipated d/c is to: home  Anticipated d/c date is: TBD Patient currently: Pending plan from cardiology  Consults called: None  Admission status: Observation     Walton Guppy, MD [facility]  06/29/2023, 9:03 PM

## 2023-06-29 NOTE — Assessment & Plan Note (Signed)
 Overall her symptoms and chest pressure worse today in comparison to last couple days and associated with new T wave inversions in inferior leads. High suspicion for unstable angina. Recommended further evaluation promptly in the emergency room to assess for acute coronary syndrome with serial troponins. If troponins negative would still recommend admission overnight and expedited evaluation with invasive cardiac catheterization coronary angiogram to rule out any significant obstructive coronary artery disease.  Continue aspirin  81 mg once daily.  Given the option to call 911 to bring him to the ER he prefers having brought to the ER by his wife. He preferred to go to the ER in private vehicle, will notify ER team at  [facility] where they will be heading to.  Also called and notified the cardiac Cath Lab team at [facility] ([place]) about his pending arrival to the ER.

## 2023-06-30 ENCOUNTER — Encounter (HOSPITAL_COMMUNITY)
Admission: EM | Disposition: A | Discharge status: Home / Self Care | Payer: Self-pay | Source: Home / Self Care | Attending: Emergency Medicine

## 2023-06-30 ENCOUNTER — Encounter (HOSPITAL_COMMUNITY): Payer: Self-pay | Admitting: Family Medicine

## 2023-06-30 DIAGNOSIS — R079 Chest pain, unspecified: Secondary | ICD-10-CM | POA: Diagnosis not present

## 2023-06-30 DIAGNOSIS — R0789 Other chest pain: Secondary | ICD-10-CM | POA: Diagnosis not present

## 2023-06-30 DIAGNOSIS — I200 Unstable angina: Secondary | ICD-10-CM | POA: Diagnosis not present

## 2023-06-30 HISTORY — PX: LEFT HEART CATH AND CORONARY ANGIOGRAPHY: CATH118249

## 2023-06-30 LAB — LIPID PANEL
Cholesterol: 158 mg/dL (ref 0–200)
HDL: 49 mg/dL (ref >40)
LDL Cholesterol: 91 mg/dL (ref 0–99)
Total CHOL/HDL Ratio: 3.2 ratio
Triglycerides: 92 mg/dL (ref <150)
VLDL: 18 mg/dL (ref 0–40)

## 2023-06-30 LAB — CBC
HCT: 35.8 % — ABNORMAL LOW (ref 39.0–52.0)
Hemoglobin: 12.4 g/dL — ABNORMAL LOW (ref 13.0–17.0)
MCH: 32.3 pg (ref 26.0–34.0)
MCHC: 34.6 g/dL (ref 30.0–36.0)
MCV: 93.2 fL (ref 80.0–100.0)
Platelets: 244 10*3/uL (ref 150–400)
RBC: 3.84 MIL/uL — ABNORMAL LOW (ref 4.22–5.81)
RDW: 13.5 % (ref 11.5–15.5)
WBC: 5.4 10*3/uL (ref 4.0–10.5)
nRBC: 0 % (ref 0.0–0.2)

## 2023-06-30 LAB — HEMOGLOBIN A1C
Hgb A1c MFr Bld: 5.4 % (ref 4.8–5.6)
Mean Plasma Glucose: 108.28 mg/dL

## 2023-06-30 LAB — BASIC METABOLIC PANEL WITH GFR
Anion gap: 6 (ref 5–15)
BUN: 8 mg/dL (ref 6–20)
CO2: 26 mmol/L (ref 22–32)
Calcium: 8.3 mg/dL — ABNORMAL LOW (ref 8.9–10.3)
Chloride: 107 mmol/L (ref 98–111)
Creatinine, Ser: 0.84 mg/dL (ref 0.61–1.24)
GFR, Estimated: >60 mL/min (ref >60)
Glucose, Bld: 98 mg/dL (ref 70–99)
Potassium: 3.7 mmol/L (ref 3.5–5.1)
Sodium: 139 mmol/L (ref 135–145)

## 2023-06-30 LAB — HEPARIN LEVEL (UNFRACTIONATED): Heparin Unfractionated: 0.34 [IU]/mL (ref 0.30–0.70)

## 2023-06-30 LAB — HIV ANTIBODY (ROUTINE TESTING W REFLEX): HIV Screen 4th Generation wRfx: NONREACTIVE

## 2023-06-30 SURGERY — LEFT HEART CATH AND CORONARY ANGIOGRAPHY
Anesthesia: LOCAL

## 2023-06-30 MED ORDER — LIDOCAINE HCL (PF) 1 % IJ SOLN
INTRAMUSCULAR | Status: DC | PRN
  Administered 2023-06-30: 2 mL

## 2023-06-30 MED ORDER — SODIUM CHLORIDE 0.9 % WEIGHT BASED INFUSION: 1.0000 mL/kg/h | INTRAVENOUS | Status: DC

## 2023-06-30 MED ORDER — SODIUM CHLORIDE 0.9% FLUSH: 3.0000 mL | INTRAVENOUS | Status: DC | PRN

## 2023-06-30 MED ORDER — FENTANYL CITRATE (PF) 100 MCG/2ML IJ SOLN
INTRAMUSCULAR | Status: DC | PRN
  Administered 2023-06-30: 25 ug via INTRAVENOUS

## 2023-06-30 MED ORDER — SODIUM CHLORIDE 0.9 % WEIGHT BASED INFUSION
3.0000 mL/kg/h | INTRAVENOUS | Status: AC
  Administered 2023-06-30: 3 mL/kg/h via INTRAVENOUS

## 2023-06-30 MED ORDER — MIDAZOLAM HCL 2 MG/2ML IJ SOLN
INTRAMUSCULAR | Status: DC | PRN
  Administered 2023-06-30: 2 mg via INTRAVENOUS

## 2023-06-30 MED ORDER — VERAPAMIL HCL 2.5 MG/ML IV SOLN
INTRAVENOUS | Status: AC
  Filled 2023-06-30: qty 2

## 2023-06-30 MED ORDER — IOHEXOL 350 MG/ML SOLN
INTRAVENOUS | Status: DC | PRN
  Administered 2023-06-30: 30 mL

## 2023-06-30 MED ORDER — HEPARIN (PORCINE) IN NACL 2000-0.9 UNIT/L-% IV SOLN
INTRAVENOUS | Status: DC | PRN
  Administered 2023-06-30: 1000 mL

## 2023-06-30 MED ORDER — SODIUM CHLORIDE 0.9 % IV SOLN: 250.0000 mL | INTRAVENOUS | Status: DC | PRN

## 2023-06-30 MED ORDER — HYDRALAZINE HCL 20 MG/ML IJ SOLN: 10.0000 mg | INTRAMUSCULAR | Status: DC | PRN

## 2023-06-30 MED ORDER — NITROGLYCERIN 0.4 MG SL SUBL: 0.4000 mg | SUBLINGUAL_TABLET | SUBLINGUAL | 0 refills | Status: AC | PRN

## 2023-06-30 MED ORDER — LIDOCAINE HCL (PF) 1 % IJ SOLN
INTRAMUSCULAR | Status: AC
  Filled 2023-06-30: qty 30

## 2023-06-30 MED ORDER — OXYCODONE-ACETAMINOPHEN 5-325 MG PO TABS: 1.0000 | ORAL_TABLET | Freq: Three times a day (TID) | ORAL | 0 refills | Status: AC | PRN

## 2023-06-30 MED ORDER — FENTANYL CITRATE (PF) 100 MCG/2ML IJ SOLN
INTRAMUSCULAR | Status: AC
  Filled 2023-06-30: qty 2

## 2023-06-30 MED ORDER — HEPARIN SODIUM (PORCINE) 1000 UNIT/ML IJ SOLN
INTRAMUSCULAR | Status: DC | PRN
Start: 2023-06-30 — End: 2023-06-30
  Administered 2023-06-30: 5000 [IU] via INTRAVENOUS

## 2023-06-30 MED ORDER — HEPARIN SODIUM (PORCINE) 1000 UNIT/ML IJ SOLN
INTRAMUSCULAR | Status: AC
  Filled 2023-06-30: qty 10

## 2023-06-30 MED ORDER — MIDAZOLAM HCL 2 MG/2ML IJ SOLN
INTRAMUSCULAR | Status: AC
  Filled 2023-06-30: qty 2

## 2023-06-30 SURGICAL SUPPLY — 6 items
CATH INFINITI AMBI 5FR TG (CATHETERS) IMPLANT
DEVICE RAD COMP TR BAND LRG (VASCULAR PRODUCTS) IMPLANT
GLIDESHEATH SLEND A-KIT 6F 22G (SHEATH) IMPLANT
GUIDEWIRE INQWIRE 1.5J.035X260 (WIRE) IMPLANT
PACK CARDIAC CATHETERIZATION (CUSTOM PROCEDURE TRAY) ×2 IMPLANT
SET ATX-X65L (MISCELLANEOUS) IMPLANT

## 2023-06-30 NOTE — Plan of Care (Signed)
 Pt is discharged to home. PIV removed. All personal belongings returned. Left unit at 1855 with wife.

## 2023-06-30 NOTE — H&P (View-Only) (Signed)
 Cardiology Consultation   Patient ID: Gabriel Flowers MRN: [id number]; DOB: February 28, 1970  Admit date: 06/29/2023 Date of Consult: 06/30/2023  PCP:  [facility]   [facility] Providers Cardiologist:  Gabriel Evans Madireddy, MD       Patient Profile:   Gabriel Flowers is a 53 y.o. male with a hx of HTN, current tobacco use (1/2 pack per day), current marijuana abuse, central retinal artery occlusion, BPH who is being seen 06/30/2023 for the evaluation of unstable angina at the request of Dr. Efrain Flowers.  History of Present Illness:   Gabriel Flowers has past medical history as stated above. He was in a good state of health until March 2025 when he started experiencing chest pain. He reported the chest pain was with physical exertion, manual labor or mowing the grass.   He was recently see in the emergency department on 06/04/2023 for chest pain that radiated to his left arm along with fatigue. Troponin levels were negative x 2, EKG showed sinus rhythm with no acute ischemic changes. Due to his chest pain fitting the picture of stable angina, happening with exertion, he was discharged from the ED, started on losartan 25 mg daily with referral to cardiology.   He was seen yesterday 06/29/2023 in our outpatient office by Dr. Ronell Flowers as a consult for chest pain. He reported that he stopped taking any medications and seeing any medical providers 2 years ago. At this recent visit it was noted that his chest pressure has gotten worse, began occurring at rest. He reported associated significant tiredness and fatigue. EKG performed at this visit showed sinus rhythm with new TWI in leads III, aVF, V1 when compared to prior EKG. Due to his symptoms worsening, occurring at rest, and new EKG changes he was directed to go to [facility] ED for further workup.  He presented to [facility] on 06/29/2023 for further evaluation of symptoms. Relevant workup in the ED includes: EKG which showed sinus  rhythm and resolution of prior TWI, troponin negative x 2, hemoglobin 12.4, CXR showed no acute findings. He was admitted to medicine service with cardiology to consult for further evaluation of unstable angina with possible cardiac cath.   After speaking with the patient, he agrees with the history as stated above. He does not have any current chest pain but states that he feels very tired. We have discussed a cardiac catheterization in detail and he is willing to proceed. While he has a history of medication noncompliance I did express the importance of having to continue to follow up and take medications regardless of what his cath results show. Patient expressed understanding. He shared with me that his son recently passed in November from an MI.   Past Medical History:  Diagnosis Date   Acute glaucoma of right eye 08/24/2018   Central retinal artery occlusion of right eye 08/23/2018   Chest pain    HTN (hypertension) 08/23/2018   Hypertension    Past Surgical History:  Procedure Laterality Date   APPENDECTOMY      Home Medications:  Prior to Admission medications   Medication Sig Start Date End Date Taking? Authorizing Provider  aspirin  EC 81 MG tablet Take 81 mg by mouth daily. Swallow whole.   Yes [provider]  Cholecalciferol (VITAMIN D-3 PO) Take 1 tablet by mouth daily.   Yes [provider]  Cyanocobalamin (VITAMIN B-12 PO) Take 1 tablet by mouth daily.   Yes [provider]  losartan (COZAAR) 25 MG tablet Take 25 mg by mouth daily. 06/16/23  Yes [provider]  tamsulosin (FLOMAX) 0.4 MG CAPS capsule Take 0.4 mg by mouth daily. 06/16/23  Yes [provider]   Inpatient Medications: Scheduled Meds:  aspirin  EC  81 mg Oral Daily   atorvastatin  80 mg Oral Daily   losartan  25 mg Oral Daily   metoprolol tartrate  12.5 mg Oral BID   tamsulosin  0.4 mg Oral Daily   Continuous Infusions:  heparin 1,100 Units/hr (06/29/23 2204)    PRN Meds: acetaminophen , nitroGLYCERIN, ondansetron  (ZOFRAN ) IV, oxyCODONE   Allergies:   No Known Allergies  Social History:   Social History   Socioeconomic History   Marital status: Single    Spouse name: Not on file   Number of children: Not on file   Years of education: Not on file   Highest education level: Not on file  Occupational History   Not on file  Tobacco Use   Smoking status: Every Day    Types: Cigarettes   Smokeless tobacco: Never  Vaping Use   Vaping status: Never Used  Substance and Sexual Activity   Alcohol use: No   Drug use: Yes    Types: Marijuana   Sexual activity: Yes  Other Topics Concern   Not on file  Social History Narrative   Not on file   Social Drivers of Health   Financial Resource Strain: Low Risk  (08/23/2018)   Overall Financial Resource Strain (CARDIA)    Difficulty of Paying Living Expenses: Not hard at all  Food Insecurity: Low Risk  (06/16/2023)   Received from [facility]   Food Insecurity    Within the past 12 months, did the food you bought just not last and you didn't have money to get more?: No    Within the past 12 months, did you worry that your food would run out before you got money to buy more?: No  Transportation Needs: Low Risk  (06/16/2023)   Received from [facility]   Transportation Needs    Within the past 12 months, has a lack of transportation kept you from medical appointments or from doing things needed for daily living?: No  Physical Activity: Unknown (08/23/2018)   Exercise Vital Sign    Days of Exercise per Week: Patient declined    Minutes of Exercise per Session: Patient declined  Stress: No Stress Concern Present (08/23/2018)   [employer] of Occupational Health - Occupational Stress Questionnaire    Feeling of Stress : Only a little  Social Connections: Unknown (08/23/2018)   Social Connection and Isolation Panel [NHANES]    Frequency of Communication with Friends  and Family: Patient declined    Frequency of Social Gatherings with Friends and Family: Patient declined    Attends Religious Services: Patient declined    Database administrator or Organizations: Patient declined    Attends Banker Meetings: Patient declined    Marital Status: Patient declined  Intimate Partner Violence: Unknown (08/23/2018)   Humiliation, Afraid, Rape, and Kick questionnaire    Fear of Current or Ex-Partner: Patient declined    Emotionally Abused: Patient declined    Physically Abused: Patient declined    Sexually Abused: Patient declined    Family History:   Family History  Problem Relation Age of Onset   Osteoarthritis Mother    Osteoarthritis Father     ROS:  Please see the  history of present illness.  All other ROS reviewed and negative.     Physical Exam/Data:   Vitals:   06/30/23 0700 06/30/23 0800 06/30/23 0900 06/30/23 1000  BP: 134/88 122/85 133/86 119/88  Pulse: (!) 58 72 (!) 57 65  Resp: 10 12 16 10   Temp:  97.8 F (36.6 C)    TempSrc:  Oral    SpO2: 100% 100% 100% 99%  Weight:      Height:        Intake/Output Summary (Last 24 hours) at 06/30/2023 1128 Last data filed at 06/30/2023 1033 Gross per 24 hour  Intake --  Output 400 ml  Net -400 ml      06/29/2023    6:11 PM 06/29/2023    2:29 PM 06/04/2023    3:28 PM  Last 3 Weights  Weight (lbs) 204 lb 200 lb 6.4 oz 210 lb  Weight (kg) 92.534 kg 90.901 kg 95.255 kg     Body mass index is 30.13 kg/m.   General:  Well nourished, well developed, in no acute distress, on room air HEENT: normal Neck: no JVD Vascular: Distal pulses 2+ bilaterally Cardiac:  normal S1, S2; RRR; no murmur  Lungs:  clear to auscultation bilaterally, no wheezing, rhonchi or rales  Abd: soft, nontender Ext: no edema Musculoskeletal:  No deformities Skin: warm and dry  Neuro:  no focal abnormalities noted Psych:  Normal affect   EKG:  The EKG was personally reviewed and demonstrates: sinus rhythm,  HR 90, resolution of previously noted TWI in inferior leads   Telemetry:  Telemetry was personally reviewed and demonstrates:  sinus rhythm, 60s  Relevant CV Studies: N/A  Laboratory Data:  High Sensitivity Troponin:   Recent Labs  Lab 06/04/23 1544 06/04/23 1812 06/29/23 1732 06/29/23 1932  TROPONINIHS 7 7 6 5      Chemistry Recent Labs  Lab 06/29/23 1732 06/30/23 0407  NA 139 139  K 3.6 3.7  CL 105 107  CO2 25 26  GLUCOSE 106* 98  BUN 9 8  CREATININE 0.99 0.84  CALCIUM 8.6* 8.3*  MG 1.9  --   GFRNONAA >60 >60  ANIONGAP 9 6    No results for input(s): "PROT", "ALBUMIN", "AST", "ALT", "ALKPHOS", "BILITOT" in the last 168 hours. Lipids  Recent Labs  Lab 06/30/23 0407  CHOL 158  TRIG 92  HDL 49  LDLCALC 91  CHOLHDL 3.2    Hematology Recent Labs  Lab 06/29/23 1732 06/30/23 0407  WBC 7.3 5.4  RBC 3.82* 3.84*  HGB 12.2* 12.4*  HCT 35.4* 35.8*  MCV 92.7 93.2  MCH 31.9 32.3  MCHC 34.5 34.6  RDW 13.5 13.5  PLT 258 244   Thyroid No results for input(s): "TSH", "FREET4" in the last 168 hours.  BNPNo results for input(s): "BNP", "PROBNP" in the last 168 hours.  DDimer No results for input(s): "DDIMER" in the last 168 hours.  Radiology/Studies:  DG Chest 2 View Result Date: 06/29/2023 CLINICAL DATA:  Chest pain. EXAM: CHEST - 2 VIEW COMPARISON:  06/04/2023 FINDINGS: The cardiomediastinal contours are normal. The lungs are clear. Pulmonary vasculature is normal. No consolidation, pleural effusion, or pneumothorax. No acute osseous abnormalities are seen. IMPRESSION: No acute findings.  Stable radiographic appearance of the chest. Electronically Signed   By: Chadwick Colonel M.D.   On: 06/29/2023 18:16   Assessment and Plan:   Unstable angina  Presented to ED from cardiology office for further evaluation of chest pain occurring at rest with new  EKG changes  EKG noted TWI in inferior leads that resolved on follow up EKG in ED Troponin negative x 2 Current  tobacco abuse -- 1/2 pack per day History of MI in: father, brother, son  LDL 91, A1C  5.4% Currently on: Losartan 25 mg daily, Lopressor 12.5 mg BID, ASA 81 mg daily, Lipitor 80 mg daily Currently on IV heparin Continue PRN SL NTG for chest pain Patient currently NPO  Tentatively scheduled for LHC today pending MD evaluation  Informed Consent   Shared Decision Making/Informed Consent The risks [stroke (1 in 1000), death (1 in 1000), kidney failure [usually temporary] (1 in 500), bleeding (1 in 200), allergic reaction [possibly serious] (1 in 200)], benefits (diagnostic support and management of coronary artery disease) and alternatives of a cardiac catheterization were discussed in detail with [facility] and he is willing to proceed.    Hypertension  Most recent BP 122/85 Recently started on Losartan in April 2025 Stopped taking all medications and seeing providers for over 2 years Currently on: Losartan 25 mg daily, Lopressor 12.5 mg BID  Per primary BPH Tobacco abuse  Marijuana abuse   Risk Assessment/Risk Scores:     TIMI Risk Score for Unstable Angina or Non-ST Elevation MI:   The patient's TIMI risk score is 2, which indicates a 8% risk of all cause mortality, new or recurrent myocardial infarction or need for urgent revascularization in the next 14 days.       For questions or updates, please contact [facility] Please consult [email or web address] for contact info under    Signed, Jiles Mote, PA-C  06/30/2023 11:28 AM  History and all data above reviewed.  Patient examined.  I agree with the findings as above.  The patient has no past cardiac history.  He has significant risk factors however.  His son died at age 83 of myocardial infarction recently.  Father died at age 60.  He had a brother die at age 96 all of myocardial infarction's.  He has never had any cardiovascular testing.  He has been an active gentleman with a physical job.  However, in the last 6 weeks  he has been developing chest discomfort with activity.  He has discomfort if he walks a flight of stairs.  He will get a pressure or sharp discomfort across his left chest and a little bit into his left shoulder blade.  It might last for minutes to hours.  It can be intense.  He is not describing associated nausea vomiting or diaphoresis.  He has been getting more short of breath.  This has been more fatigue and loss exercise tolerance.  He presented to the emergency room where he was not noted to have acute EKG changes or enzyme elevation.  However, he continues to have some discomfort.  The patient exam reveals COR:RRR no rubs  ,  Lungs: Clear to auscultation bilaterally,  Abd: Positive bowel sounds normal frequency and patient agrees, rebound or guarding, Ext 2+ pulses, no edema..  All available labs, radiology testing, previous records reviewed. Agree with documented assessment and plan.  Chest discomfort: His chest discomfort is very worrisome for unstable angina.  He has significant cardiovascular risk factors.  Pretest probability of obstructive coronary disease is high.  Cardiac catheterization is indicated.  He will remain on heparin and aspirin .  Tobacco abuse:  We discussed this.  We will talk nicotine replacement versus Chantix at discharge.  Risk reduction: We will start him empirically on statins.  Hypertension: Continue losartan.  He has been started on a low-dose beta-blocker with his symptoms.  Royston Cornea Hatem Cull  1:52 PM  06/30/2023

## 2023-06-30 NOTE — Progress Notes (Signed)
 PROGRESS NOTE  [facility] UJW:[id number] DOB: 08-30-1970 DOA: 06/29/2023 PCP: [facility]   LOS: 0 days   Brief narrative:   Gab[facility] is a 53 y.o. male with medical history significant for hypertension and central retinal artery occlusion presented to hospital with chest discomfort for couple of days mostly with exertion with associated fatigue.  Patient had been to cardiology clinic when he was noted to have new T wave inversion in lead III and aVF and was sent to the hospital for cardiac catheterization.  In the ED patient had stable vitals.  EKG showed normal sinus rhythm with resolution of previous T wave inversion.  Labs were within normal range.  Troponins were negative.  Chest x-ray was negative for infiltrate.  Patient was given aspirin  and morphine in the ED and was admitted hospital for further evaluation and treatment.   Assessment/Plan: Principal Problem:   Unstable angina [facility]) Active Problems:   Hypertension   Unstable angina  Sent from cardiology clinic for management of unstable angina.  Continue IV heparin, high-intensity statin, beta-blocker, continue ASA, use NTG as-needed.  Still complains of some chest pressure.  Currently NPO.  Will follow cardiology recommendation.  Likely need cardiac catheterization.  History of hypertension, history of central retinal occlusion. Blood pressure seems to be stable at this time.  DVT prophylaxis: Heparin drip   Disposition: Home likely  07/01/2023  Status is: Observation The patient will require care spanning > 2 midnights and should be moved to inpatient because: Pending clinical improvement, possible need for cardiac catheterization, cardiology evaluation    Code Status:     Code Status: Full Code  Family Communication: None  Consultants: Cardiology  Procedures: None yet  Anti-infectives:  None   Anti-infectives (From admission, onward)    None        Subjective: Today, patient  was seen and examined at bedside.  Seen in the emergency room.  Still complains of chest pressure.  Denies any dyspnea at rest or diaphoresis dizziness lightheadedness or severe chest pain.  Complains of dyspnea on exertion.  Objective: Vitals:   06/30/23 0700 06/30/23 0800  BP: 134/88 122/85  Pulse: (!) 58 72  Resp: 10 12  Temp:  97.8 F (36.6 C)  SpO2: 100% 100%   No intake or output data in the 24 hours ending 06/30/23 1028 Filed Weights   06/29/23 1811  Weight: 92.5 kg   Body mass index is 30.13 kg/m.   Physical Exam:  GENERAL: Patient is alert awake and oriented. Not in obvious distress. HENT: No scleral pallor or icterus. Pupils equally reactive to light. Oral mucosa is moist NECK: is supple, no gross swelling noted. CHEST: Clear to auscultation. No crackles or wheezes.  Diminished breath sounds bilaterally. CVS: S1 and S2 heard, no murmur. Regular rate and rhythm.  ABDOMEN: Soft, non-tender, bowel sounds are present. EXTREMITIES: No edema. CNS: Cranial nerves are intact. No focal motor deficits. SKIN: warm and dry without rashes.  Data Review: I have personally reviewed the following laboratory data and studies,  CBC: Recent Labs  Lab 06/29/23 1732 06/30/23 0407  WBC 7.3 5.4  HGB 12.2* 12.4*  HCT 35.4* 35.8*  MCV 92.7 93.2  PLT 258 244   Basic Metabolic Panel: Recent Labs  Lab 06/29/23 1732 06/30/23 0407  NA 139 139  K 3.6 3.7  CL 105 107  CO2 25 26  GLUCOSE 106* 98  BUN 9 8  CREATININE 0.99 0.84  CALCIUM 8.6* 8.3*  MG 1.9  --    Liver Function Tests: No results for input(s): "AST", "ALT", "ALKPHOS", "BILITOT", "PROT", "ALBUMIN" in the last 168 hours. No results for input(s): "LIPASE", "AMYLASE" in the last 168 hours. No results for input(s): "AMMONIA" in the last 168 hours. Cardiac Enzymes: No results for input(s): "CKTOTAL", "CKMB", "CKMBINDEX", "TROPONINI" in the last 168 hours. BNP (last 3 results) No results for input(s): "BNP" in the  last 8760 hours.  ProBNP (last 3 results) No results for input(s): "PROBNP" in the last 8760 hours.  CBG: No results for input(s): "GLUCAP" in the last 168 hours. No results found for this or any previous visit (from the past 240 hours).   Studies: DG Chest 2 View Result Date: 06/29/2023 CLINICAL DATA:  Chest pain. EXAM: CHEST - 2 VIEW COMPARISON:  06/04/2023 FINDINGS: The cardiomediastinal contours are normal. The lungs are clear. Pulmonary vasculature is normal. No consolidation, pleural effusion, or pneumothorax. No acute osseous abnormalities are seen. IMPRESSION: No acute findings.  Stable radiographic appearance of the chest. Electronically Signed   By: Chadwick Colonel M.D.   On: 06/29/2023 18:16      Rosena Conradi, MD  [facility] 06/30/2023  If 7PM-7AM, please contact night-coverage

## 2023-06-30 NOTE — Interval H&P Note (Signed)
 History and Physical Interval Note:  06/30/2023 3:28 PM  [facility]  has presented today for surgery, with the diagnosis of unstable angina.  The various methods of treatment have been discussed with the patient and family. After consideration of risks, benefits and other options for treatment, the patient has consented to  Procedure(s): LEFT HEART CATH AND CORONARY ANGIOGRAPHY (N/A) and possible coronary angioplasty for unstable angina as a surgical intervention.  The patient's history has been reviewed, patient examined, no change in status, stable for surgery.  I have reviewed the patient's chart and labs.  Questions were answered to the patient's satisfaction.     [name]

## 2023-06-30 NOTE — Discharge Summary (Signed)
 Physician Discharge Summary  Gabriel Flowers UJW:[id number] DOB: [birth year] DOA: 06/29/2023  PCP: [facility]  Admit date: 06/29/2023 Discharge date: 06/30/2023  Admitted From: Home  Discharge disposition: home   Recommendations for Outpatient Follow-Up:   Follow up with your primary care provider in 1 to 2 weeks Check CBC, BMP, magnesium  in the next visit Follow up with cardiology as scheduled by the clinic  Discharge Diagnosis:   Principal Problem:   Atypical chest pain Active Problems:   Hypertension   Discharge Condition: Improved.  Diet recommendation: Low sodium, heart healthy.    Wound care: None.  Code status: Full.   History of Present Illness:   Gabriel Flowers is a 53 y.o. male with medical history significant for hypertension and central retinal artery occlusion presented to hospital with chest discomfort for couple of days mostly with exertion with associated fatigue.  Patient had been to cardiology clinic when he was noted to have new T wave inversion in lead III and aVF and was sent to the hospital for cardiac catheterization.  In the ED patient had stable vitals.  EKG showed normal sinus rhythm with resolution of previous T wave inversion.  Labs were within normal range.  Troponins were negative.  Chest x-ray was negative for infiltrate.  Patient was given aspirin  and morphine  in the ED and was admitted hospital for further evaluation and treatment.    Hospital Course:   Following conditions were addressed during hospitalization as listed below,  Chest pain likely atypical from hypertension. Initial thought was unstable angina.  Sent from cardiology clinic for management of unstable angina.  Patient initially received IV heparin , high-intensity statin, beta-blocker, continue ASA, use NTG as-needed.  He complained of chest pressure and dyspnea on exertion.  Patient subsequently underwent cardiac catheterization with clean coronaries.   Communicated with cardiology and okay for discharge.  Will prescribe nitroglycerin  as needed, continue losartan  and Percocet for few days for atypical pain.  Cardiology has plans for follow-up as outpatient.   History of hypertension, history of central retinal occlusion. Blood pressure seems to be stable at this time.  Disposition.  At this time, patient is stable for disposition home with outpatient PCP and cardiology follow-up.  Medical Consultants:   Cardiology  Procedures:    Cardiac catheterization 06/30/2023 Subjective:   Today, patient was seen and examined at bedside.  He had some chest pressure and dyspnea on exertion prior to cath.  Discharge Exam:   Vitals:   06/30/23 1540 06/30/23 1545  BP: 120/78 120/82  Pulse: 72 76  Resp: 19 19  Temp:    SpO2: 99% 95%   Vitals:   06/30/23 1530 06/30/23 1535 06/30/23 1540 06/30/23 1545  BP: 123/78 125/78 120/78 120/82  Pulse: 68 75 72 76  Resp: (!) 26 17 19 19   Temp:      TempSrc:      SpO2: 100% 98% 99% 95%  Weight:      Height:        GENERAL: Patient is alert awake and oriented. Not in obvious distress. HENT: No scleral pallor or icterus. Pupils equally reactive to light. Oral mucosa is moist NECK: is supple, no gross swelling noted. CHEST: Clear to auscultation. No crackles or wheezes.  Diminished breath sounds bilaterally. CVS: S1 and S2 heard, no murmur. Regular rate and rhythm.  ABDOMEN: Soft, non-tender, bowel sounds are present. EXTREMITIES: No edema. CNS: Cranial nerves are intact. No focal motor deficits. SKIN: warm and dry without rashes  The results of significant diagnostics from this hospitalization (including imaging, microbiology, ancillary and laboratory) are listed below for reference.     Diagnostic Studies:   DG Chest 2 View Result Date: 06/29/2023 CLINICAL DATA:  Chest pain. EXAM: CHEST - 2 VIEW COMPARISON:  06/04/2023 FINDINGS: The cardiomediastinal contours are normal. The lungs are clear.  Pulmonary vasculature is normal. No consolidation, pleural effusion, or pneumothorax. No acute osseous abnormalities are seen. IMPRESSION: No acute findings.  Stable radiographic appearance of the chest. Electronically Signed   By: Chadwick Colonel M.D.   On: 06/29/2023 18:16     Labs:   Basic Metabolic Panel: Recent Labs  Lab 06/29/23 1732 06/30/23 0407  NA 139 139  K 3.6 3.7  CL 105 107  CO2 25 26  GLUCOSE 106* 98  BUN 9 8  CREATININE 0.99 0.84  CALCIUM  8.6* 8.3*  MG 1.9  --    GFR Estimated Creatinine Clearance: 111.6 mL/min (by C-G formula based on SCr of 0.84 mg/dL). Liver Function Tests: No results for input(s): "AST", "ALT", "ALKPHOS", "BILITOT", "PROT", "ALBUMIN" in the last 168 hours. No results for input(s): "LIPASE", "AMYLASE" in the last 168 hours. No results for input(s): "AMMONIA" in the last 168 hours. Coagulation profile No results for input(s): "INR", "PROTIME" in the last 168 hours.  CBC: Recent Labs  Lab 06/29/23 1732 06/30/23 0407  WBC 7.3 5.4  HGB 12.2* 12.4*  HCT 35.4* 35.8*  MCV 92.7 93.2  PLT 258 244   Cardiac Enzymes: No results for input(s): "CKTOTAL", "CKMB", "CKMBINDEX", "TROPONINI" in the last 168 hours. BNP: Invalid input(s): "POCBNP" CBG: No results for input(s): "GLUCAP" in the last 168 hours. D-Dimer No results for input(s): "DDIMER" in the last 72 hours. Hgb A1c Recent Labs    06/30/23 0407  HGBA1C 5.4   Lipid Profile Recent Labs    06/30/23 0407  CHOL 158  HDL 49  LDLCALC 91  TRIG 92  CHOLHDL 3.2   Thyroid function studies No results for input(s): "TSH", "T4TOTAL", "T3FREE", "THYROIDAB" in the last 72 hours.  Invalid input(s): "FREET3" Anemia work up No results for input(s): "VITAMINB12", "FOLATE", "FERRITIN", "TIBC", "IRON", "RETICCTPCT" in the last 72 hours. Microbiology No results found for this or any previous visit (from the past 240 hours).   Discharge Instructions:   Discharge Instructions      Diet - low sodium heart healthy   Complete by: As directed    Discharge instructions   Complete by: As directed    Follow up with your primary care provider in 1-2 weeks. Seek medical attention for worsening symptoms. Follow up with cardiology as scheduled by the clinic.   Increase activity slowly   Complete by: As directed       Allergies as of 06/30/2023   No Known Allergies      Medication List     TAKE these medications    aspirin  EC 81 MG tablet Take 81 mg by mouth daily. Swallow whole.   losartan  25 MG tablet Commonly known as: COZAAR  Take 25 mg by mouth daily.   nitroGLYCERIN  0.4 MG SL tablet Commonly known as: Nitrostat  Place 1 tablet (0.4 mg total) under the tongue every 5 (five) minutes as needed for chest pain.   oxyCODONE -acetaminophen  5-325 MG tablet Commonly known as: Percocet Take 1 tablet by mouth every 8 (eight) hours as needed for up to 3 days for severe pain (pain score 7-10) or moderate pain (pain score 4-6).   tamsulosin  0.4 MG Caps capsule  Commonly known as: FLOMAX  Take 0.4 mg by mouth daily.   VITAMIN B-12 PO Take 1 tablet by mouth daily.   VITAMIN D-3 PO Take 1 tablet by mouth daily.         Follow-up Information     [facility] Follow up in 1 week(s).   Specialty: General Practice Contact information: [address] [place] [address] [phone]                  Time coordinating discharge: 39 minutes  Signed:  Dannah Ryles  [facility] 06/30/2023, 4:32 PM

## 2023-06-30 NOTE — Progress Notes (Signed)
 PHARMACY - ANTICOAGULATION Pharmacy Consult for heparin  Indication: chest pain/ACS Brief A/P: Heparin level within goal range Continue Heparin at current rate   No Known Allergies  Patient Measurements: Height: 5\' 9"  (175.3 cm) Weight: 92.5 kg (204 lb) IBW/kg (Calculated) : 70.7 HEPARIN DW (KG): 89.6  Vital Signs: Temp: 98.2 F (36.8 C) (05/06 0421) Temp Source: Oral (05/06 0421) BP: 104/70 (05/06 0200) Pulse Rate: 66 (05/06 0200)  Labs: Recent Labs    06/29/23 1732 06/29/23 1932 06/30/23 0407  HGB 12.2*  --  12.4*  HCT 35.4*  --  35.8*  PLT 258  --  244  HEPARINUNFRC  --   --  0.34  CREATININE 0.99  --  0.84  TROPONINIHS 6 5  --     Estimated Creatinine Clearance: 111.6 mL/min (by C-G formula based on SCr of 0.84 mg/dL).  Assessment: 53 y.o. male with chest pain for heparin   Goal of Therapy:  Heparin level 0.3-0.7 units/ml Monitor platelets by anticoagulation protocol: Yes   Plan:  No change to heparin for now F/U plan for cardiac workup  Claudine Cullens, PharmD, BCPS  06/30/2023,4:56 AM

## 2023-06-30 NOTE — Consult Note (Addendum)
 Cardiology Consultation   Patient ID: [name] MRN: [id number]; DOB: February 28, 1970  Admit date: 06/29/2023 Date of Consult: 06/30/2023  PCP:  [facility]   [facility] Providers Cardiologist:  Daymon Evans Madireddy, MD       Patient Profile:   Gabriel Flowers is a 53 y.o. male with a hx of HTN, current tobacco use (1/2 pack per day), current marijuana abuse, central retinal artery occlusion, BPH who is being seen 06/30/2023 for the evaluation of unstable angina at the request of Dr. Efrain Grant.  History of Present Illness:   Gabriel Flowers has past medical history as stated above. He was in a good state of health until March 2025 when he started experiencing chest pain. He reported the chest pain was with physical exertion, manual labor or mowing the grass.   He was recently see in the emergency department on 06/04/2023 for chest pain that radiated to his left arm along with fatigue. Troponin levels were negative x 2, EKG showed sinus rhythm with no acute ischemic changes. Due to his chest pain fitting the picture of stable angina, happening with exertion, he was discharged from the ED, started on losartan 25 mg daily with referral to cardiology.   He was seen yesterday 06/29/2023 in our outpatient office by Dr. Ronell Coe as a consult for chest pain. He reported that he stopped taking any medications and seeing any medical providers 2 years ago. At this recent visit it was noted that his chest pressure has gotten worse, began occurring at rest. He reported associated significant tiredness and fatigue. EKG performed at this visit showed sinus rhythm with new TWI in leads III, aVF, V1 when compared to prior EKG. Due to his symptoms worsening, occurring at rest, and new EKG changes he was directed to go to [facility] ED for further workup.  He presented to [name] ED on 06/29/2023 for further evaluation of symptoms. Relevant workup in the ED includes: EKG which showed sinus  rhythm and resolution of prior TWI, troponin negative x 2, hemoglobin 12.4, CXR showed no acute findings. He was admitted to medicine service with cardiology to consult for further evaluation of unstable angina with possible cardiac cath.   After speaking with the patient, he agrees with the history as stated above. He does not have any current chest pain but states that he feels very tired. We have discussed a cardiac catheterization in detail and he is willing to proceed. While he has a history of medication noncompliance I did express the importance of having to continue to follow up and take medications regardless of what his cath results show. Patient expressed understanding. He shared with me that his son recently passed in November from an MI.   Past Medical History:  Diagnosis Date   Acute glaucoma of right eye 08/24/2018   Central retinal artery occlusion of right eye 08/23/2018   Chest pain    HTN (hypertension) 08/23/2018   Hypertension    Past Surgical History:  Procedure Laterality Date   APPENDECTOMY      Home Medications:  Prior to Admission medications   Medication Sig Start Date End Date Taking? Authorizing Provider  aspirin  EC 81 MG tablet Take 81 mg by mouth daily. Swallow whole.   Yes [provider]  Cholecalciferol (VITAMIN D-3 PO) Take 1 tablet by mouth daily.   Yes [provider]  Cyanocobalamin (VITAMIN B-12 PO) Take 1 tablet by mouth daily.   Yes [provider]  losartan (COZAAR) 25 MG tablet Take 25 mg by mouth daily. 06/16/23  Yes [provider]  tamsulosin (FLOMAX) 0.4 MG CAPS capsule Take 0.4 mg by mouth daily. 06/16/23  Yes [provider]   Inpatient Medications: Scheduled Meds:  aspirin  EC  81 mg Oral Daily   atorvastatin  80 mg Oral Daily   losartan  25 mg Oral Daily   metoprolol tartrate  12.5 mg Oral BID   tamsulosin  0.4 mg Oral Daily   Continuous Infusions:  heparin 1,100 Units/hr (06/29/23 2204)    PRN Meds: acetaminophen , nitroGLYCERIN, ondansetron  (ZOFRAN ) IV, oxyCODONE   Allergies:   No Known Allergies  Social History:   Social History   Socioeconomic History   Marital status: Single    Spouse name: Not on file   Number of children: Not on file   Years of education: Not on file   Highest education level: Not on file  Occupational History   Not on file  Tobacco Use   Smoking status: Every Day    Types: Cigarettes   Smokeless tobacco: Never  Vaping Use   Vaping status: Never Used  Substance and Sexual Activity   Alcohol use: No   Drug use: Yes    Types: Marijuana   Sexual activity: Yes  Other Topics Concern   Not on file  Social History Narrative   Not on file   Social Drivers of Health   Financial Resource Strain: Low Risk  (08/23/2018)   Overall Financial Resource Strain (CARDIA)    Difficulty of Paying Living Expenses: Not hard at all  Food Insecurity: Low Risk  (06/16/2023)   Received from [facility]   Food Insecurity    Within the past 12 months, did the food you bought just not last and you didn't have money to get more?: No    Within the past 12 months, did you worry that your food would run out before you got money to buy more?: No  Transportation Needs: Low Risk  (06/16/2023)   Received from [facility]   Transportation Needs    Within the past 12 months, has a lack of transportation kept you from medical appointments or from doing things needed for daily living?: No  Physical Activity: Unknown (08/23/2018)   Exercise Vital Sign    Days of Exercise per Week: Patient declined    Minutes of Exercise per Session: Patient declined  Stress: No Stress Concern Present (08/23/2018)   [employer] of Occupational Health - Occupational Stress Questionnaire    Feeling of Stress : Only a little  Social Connections: Unknown (08/23/2018)   Social Connection and Isolation Panel [NHANES]    Frequency of Communication with Friends  and Family: Patient declined    Frequency of Social Gatherings with Friends and Family: Patient declined    Attends Religious Services: Patient declined    Database administrator or Organizations: Patient declined    Attends Banker Meetings: Patient declined    Marital Status: Patient declined  Intimate Partner Violence: Unknown (08/23/2018)   Humiliation, Afraid, Rape, and Kick questionnaire    Fear of Current or Ex-Partner: Patient declined    Emotionally Abused: Patient declined    Physically Abused: Patient declined    Sexually Abused: Patient declined    Family History:   Family History  Problem Relation Age of Onset   Osteoarthritis Mother    Osteoarthritis Father     ROS:  Please see the  history of present illness.  All other ROS reviewed and negative.     Physical Exam/Data:   Vitals:   06/30/23 0700 06/30/23 0800 06/30/23 0900 06/30/23 1000  BP: 134/88 122/85 133/86 119/88  Pulse: (!) 58 72 (!) 57 65  Resp: 10 12 16 10   Temp:  97.8 F (36.6 C)    TempSrc:  Oral    SpO2: 100% 100% 100% 99%  Weight:      Height:        Intake/Output Summary (Last 24 hours) at 06/30/2023 1128 Last data filed at 06/30/2023 1033 Gross per 24 hour  Intake --  Output 400 ml  Net -400 ml      06/29/2023    6:11 PM 06/29/2023    2:29 PM 06/04/2023    3:28 PM  Last 3 Weights  Weight (lbs) 204 lb 200 lb 6.4 oz 210 lb  Weight (kg) 92.534 kg 90.901 kg 95.255 kg     Body mass index is 30.13 kg/m.   General:  Well nourished, well developed, in no acute distress, on room air HEENT: normal Neck: no JVD Vascular: Distal pulses 2+ bilaterally Cardiac:  normal S1, S2; RRR; no murmur  Lungs:  clear to auscultation bilaterally, no wheezing, rhonchi or rales  Abd: soft, nontender Ext: no edema Musculoskeletal:  No deformities Skin: warm and dry  Neuro:  no focal abnormalities noted Psych:  Normal affect   EKG:  The EKG was personally reviewed and demonstrates: sinus rhythm,  HR 90, resolution of previously noted TWI in inferior leads   Telemetry:  Telemetry was personally reviewed and demonstrates:  sinus rhythm, 60s  Relevant CV Studies: N/A  Laboratory Data:  High Sensitivity Troponin:   Recent Labs  Lab 06/04/23 1544 06/04/23 1812 06/29/23 1732 06/29/23 1932  TROPONINIHS 7 7 6 5      Chemistry Recent Labs  Lab 06/29/23 1732 06/30/23 0407  NA 139 139  K 3.6 3.7  CL 105 107  CO2 25 26  GLUCOSE 106* 98  BUN 9 8  CREATININE 0.99 0.84  CALCIUM 8.6* 8.3*  MG 1.9  --   GFRNONAA >60 >60  ANIONGAP 9 6    No results for input(s): "PROT", "ALBUMIN", "AST", "ALT", "ALKPHOS", "BILITOT" in the last 168 hours. Lipids  Recent Labs  Lab 06/30/23 0407  CHOL 158  TRIG 92  HDL 49  LDLCALC 91  CHOLHDL 3.2    Hematology Recent Labs  Lab 06/29/23 1732 06/30/23 0407  WBC 7.3 5.4  RBC 3.82* 3.84*  HGB 12.2* 12.4*  HCT 35.4* 35.8*  MCV 92.7 93.2  MCH 31.9 32.3  MCHC 34.5 34.6  RDW 13.5 13.5  PLT 258 244   Thyroid No results for input(s): "TSH", "FREET4" in the last 168 hours.  BNPNo results for input(s): "BNP", "PROBNP" in the last 168 hours.  DDimer No results for input(s): "DDIMER" in the last 168 hours.  Radiology/Studies:  DG Chest 2 View Result Date: 06/29/2023 CLINICAL DATA:  Chest pain. EXAM: CHEST - 2 VIEW COMPARISON:  06/04/2023 FINDINGS: The cardiomediastinal contours are normal. The lungs are clear. Pulmonary vasculature is normal. No consolidation, pleural effusion, or pneumothorax. No acute osseous abnormalities are seen. IMPRESSION: No acute findings.  Stable radiographic appearance of the chest. Electronically Signed   By: Chadwick Colonel M.D.   On: 06/29/2023 18:16   Assessment and Plan:   Unstable angina  Presented to ED from cardiology office for further evaluation of chest pain occurring at rest with new  EKG changes  EKG noted TWI in inferior leads that resolved on follow up EKG in ED Troponin negative x 2 Current  tobacco abuse -- 1/2 pack per day History of MI in: father, brother, son  LDL 91, A1C  5.4% Currently on: Losartan 25 mg daily, Lopressor 12.5 mg BID, ASA 81 mg daily, Lipitor 80 mg daily Currently on IV heparin Continue PRN SL NTG for chest pain Patient currently NPO  Tentatively scheduled for LHC today pending MD evaluation  Informed Consent   Shared Decision Making/Informed Consent The risks [stroke (1 in 1000), death (1 in 1000), kidney failure [usually temporary] (1 in 500), bleeding (1 in 200), allergic reaction [possibly serious] (1 in 200)], benefits (diagnostic support and management of coronary artery disease) and alternatives of a cardiac catheterization were discussed in detail with Mr. Klahn and he is willing to proceed.    Hypertension  Most recent BP 122/85 Recently started on Losartan in April 2025 Stopped taking all medications and seeing providers for over 2 years Currently on: Losartan 25 mg daily, Lopressor 12.5 mg BID  Per primary BPH Tobacco abuse  Marijuana abuse   Risk Assessment/Risk Scores:     TIMI Risk Score for Unstable Angina or Non-ST Elevation MI:   The patient's TIMI risk score is 2, which indicates a 8% risk of all cause mortality, new or recurrent myocardial infarction or need for urgent revascularization in the next 14 days.       For questions or updates, please contact [facility] Please consult [email or web address] for contact info under    Signed, Jiles Mote, PA-C  06/30/2023 11:28 AM  History and all data above reviewed.  Patient examined.  I agree with the findings as above.  The patient has no past cardiac history.  He has significant risk factors however.  His son died at age 83 of myocardial infarction recently.  Father died at age 60.  He had a brother die at age 96 all of myocardial infarction's.  He has never had any cardiovascular testing.  He has been an active gentleman with a physical job.  However, in the last 6 weeks  he has been developing chest discomfort with activity.  He has discomfort if he walks a flight of stairs.  He will get a pressure or sharp discomfort across his left chest and a little bit into his left shoulder blade.  It might last for minutes to hours.  It can be intense.  He is not describing associated nausea vomiting or diaphoresis.  He has been getting more short of breath.  This has been more fatigue and loss exercise tolerance.  He presented to the emergency room where he was not noted to have acute EKG changes or enzyme elevation.  However, he continues to have some discomfort.  The patient exam reveals COR:RRR no rubs  ,  Lungs: Clear to auscultation bilaterally,  Abd: Positive bowel sounds normal frequency and patient agrees, rebound or guarding, Ext 2+ pulses, no edema..  All available labs, radiology testing, previous records reviewed. Agree with documented assessment and plan.  Chest discomfort: His chest discomfort is very worrisome for unstable angina.  He has significant cardiovascular risk factors.  Pretest probability of obstructive coronary disease is high.  Cardiac catheterization is indicated.  He will remain on heparin and aspirin .  Tobacco abuse:  We discussed this.  We will talk nicotine replacement versus Chantix at discharge.  Risk reduction: We will start him empirically on statins.  Hypertension: Continue losartan.  He has been started on a low-dose beta-blocker with his symptoms.  Royston Cornea Hatem Cull  1:52 PM  06/30/2023

## 2023-06-30 NOTE — Hospital Course (Signed)
 KATSUMI PAULOVICH is a 53 y.o. male with medical history significant for hypertension and central retinal artery occlusion presented to hospital with chest discomfort for couple of days mostly with exertion with associated fatigue.  Patient had been to cardiology clinic when he was noted to have new T wave inversion in lead III and aVF and was sent to the hospital for cardiac catheterization.  In the ED patient had stable vitals.  EKG showed normal sinus rhythm with resolution of previous T wave inversion.  Labs were within normal range.  Troponins were negative.  Chest x-ray was negative for infiltrate.  Patient was given aspirin  and morphine in the ED and was admitted hospital for further evaluation and treatment.   Unstable angina  - Sent from cardiology clinic for management of unstable angina.  Continue IV heparin, high-intensity statin, beta-blocker, continue ASA, use NTG as-needed.  Will follow cardiology recommendation.  History of hypertension, history of central retinal occlusion.

## 2023-06-30 NOTE — Progress Notes (Signed)
 Reviewed AVS with patient answered all questions  Floor to finish

## 2023-06-30 NOTE — ED Provider Notes (Incomplete)
 [place] [facility] AT [facility] Provider Note   CSN: [id number] Arrival date & time: 06/29/23  1714     History {Add pertinent medical, surgical, social history, OB history to HPI:1} No chief complaint on file.   Gabriel Flowers is a 53 y.o. male.  With a history of hypertension, and tobacco use who presents to the ED for chest pain.  He has experienced ongoing chest pain which is a pressure quality over the last couple weeks.  Pain made worse with exertion especially climbing stairs.  Localized over midsternal region with radiation to the left upper extremity.  For this reason was seen by cardiology in the office today (Dr.Madireddy) who appreciated new EKG changes from prior (new T wave inversions in inferior leads) and directed him to the ED for further evaluation given concern for unstable angina with EKG changes.  Patient does note some chest pressure currently.  Had 81 mg aspirin  earlier today.  No shortness of breath nausea vomiting fevers or chills.  Positive family history of MI including father, brother and son.  HPI     Home Medications Prior to Admission medications   Medication Sig Start Date End Date Taking? Authorizing Provider  chlorhexidine (HIBICLENS ) 4 % external liquid Apply topically daily as needed. Dilute 10-15 mL in water, Use daily when bathing for 1-2 weeks 10/15/20   Ethlyn Herd, MD  ibuprofen  (ADVIL ) 600 MG tablet Take 1 tablet (600 mg total) by mouth every 6 (six) hours as needed. 10/15/20   Mortenson, Ashley, MD  losartan (COZAAR) 25 MG tablet Take 25 mg by mouth daily. 06/16/23   [provider]  tamsulosin (FLOMAX) 0.4 MG CAPS capsule Take 0.4 mg by mouth daily. 06/16/23   [provider]      Allergies    Patient has no known allergies.    Review of Systems   Review of Systems  Physical Exam Updated Vital Signs BP 127/88 (BP Location: Right Arm)   Pulse 78   Temp 97.9 F (36.6 C) (Oral)   Resp 17    SpO2 97%  Physical Exam Vitals and nursing note reviewed.  HENT:     Head: Normocephalic and atraumatic.  Eyes:     Pupils: Pupils are equal, round, and reactive to light.  Cardiovascular:     Rate and Rhythm: Normal rate and regular rhythm.  Pulmonary:     Effort: Pulmonary effort is normal.     Breath sounds: Normal breath sounds.  Abdominal:     Palpations: Abdomen is soft.     Tenderness: There is no abdominal tenderness.  Skin:    General: Skin is warm and dry.  Neurological:     Mental Status: He is alert.  Psychiatric:        Mood and Affect: Mood normal.     ED Results / Procedures / Treatments   Labs (all labs ordered are listed, but only abnormal results are displayed) Labs Reviewed  BASIC METABOLIC PANEL WITH GFR  CBC  MAGNESIUM   TROPONIN I (HIGH SENSITIVITY)    EKG None  Radiology No results found.  Procedures Procedures  {Document cardiac monitor, telemetry assessment procedure when appropriate:1}  Medications Ordered in ED Medications  aspirin  chewable tablet 243 mg (has no administration in time range)    ED Course/ Medical Decision Making/ A&P   {   Click here for ABCD2, HEART and other calculatorsREFRESH Note before signing :1}  Medical Decision Making 54 year old male with history as above presenting from cardiology office given concern for unstable angina with new T wave inversions in inferior leads on EKG today.  2 weeks of ongoing chest pain worsened with exertion not improving entirely with rest.  Takes daily aspirin .  2 Tylenol  earlier.  No other medications prior to arrival.  Continuing to endorse active chest pain on my assessment.  Hemodynamically stable.  Normal sinus rhythm.  Will obtain cardiac workup including troponin, delta troponin, CBC, metabolic panel magnesium  and continue to monitor on telemetry.  Will obtain serial EKGs and talk over with cardiology.  Plan for admission with cardiac  catheterization either tomorrow morning or sooner should this raise concern for an acute event based on EKG changes and serial troponin.  Amount and/or Complexity of Data Reviewed Labs: ordered. Radiology: ordered.  Risk OTC drugs. Prescription drug management. Decision regarding hospitalization.     {Document critical care time when appropriate:1} {Document review of labs and clinical decision tools ie heart score, Chads2Vasc2 etc:1}  {Document your independent review of radiology images, and any outside records:1} {Document your discussion with family members, caretakers, and with consultants:1} {Document social determinants of health affecting pt's care:1} {Document your decision making why or why not admission, treatments were needed:1} Final Clinical Impression(s) / ED Diagnoses Final diagnoses:  None    Rx / DC Orders ED Discharge Orders     None

## 2023-06-30 NOTE — Plan of Care (Signed)

## 2023-07-01 ENCOUNTER — Encounter (HOSPITAL_COMMUNITY): Payer: Self-pay | Admitting: Cardiology

## 2023-07-01 LAB — LIPOPROTEIN A (LPA): Lipoprotein (a): 84.5 nmol/L — ABNORMAL HIGH

## 2023-07-01 MED FILL — Verapamil HCl IV Soln 2.5 MG/ML: INTRAVENOUS | Qty: 2 | Status: AC

## 2023-07-16 ENCOUNTER — Ambulatory Visit

## 2023-09-14 ENCOUNTER — Ambulatory Visit: Admitting: Internal Medicine
